# Patient Record
Sex: Male | Born: 1965 | Race: White | Hispanic: No | Marital: Married
Health system: Midwestern US, Academic
[De-identification: ages and names within clinical notes are randomized; demographics above are authoritative.]

---

## 2017-05-23 ENCOUNTER — Encounter: Admit: 2017-05-23 | Discharge: 2017-05-23

## 2017-05-23 DIAGNOSIS — E559 Vitamin D deficiency, unspecified: ICD-10-CM

## 2017-05-23 DIAGNOSIS — E039 Hypothyroidism, unspecified: ICD-10-CM

## 2017-05-23 DIAGNOSIS — E119 Type 2 diabetes mellitus without complications: ICD-10-CM

## 2017-05-23 DIAGNOSIS — E7800 Pure hypercholesterolemia, unspecified: Principal | ICD-10-CM

## 2017-12-29 IMAGING — US DUPRENAL
1 series · 13 of 25 positions shown · non-contrast
Comparison: none

[Series 1: us duplex renal artery · 13 of 66 slices shown]
[im 1/66]
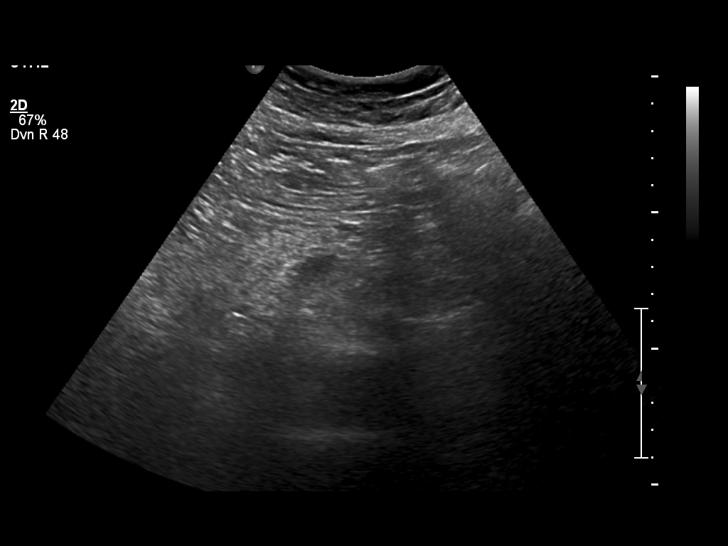
[im 6/66]
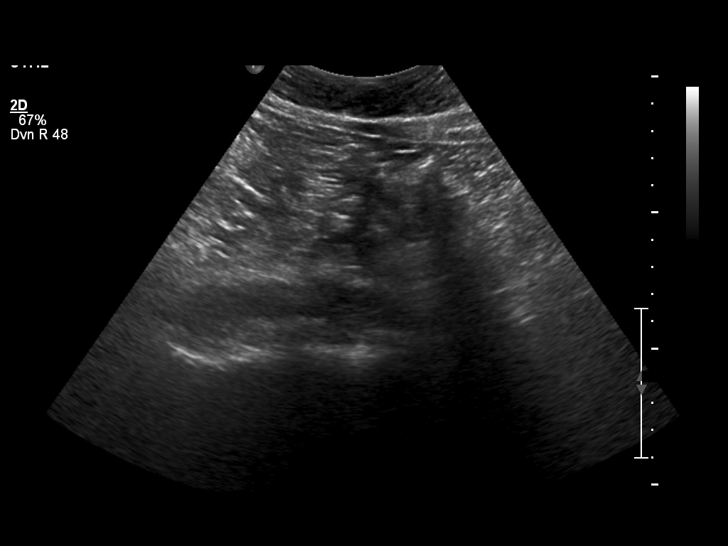
[im 11/66]
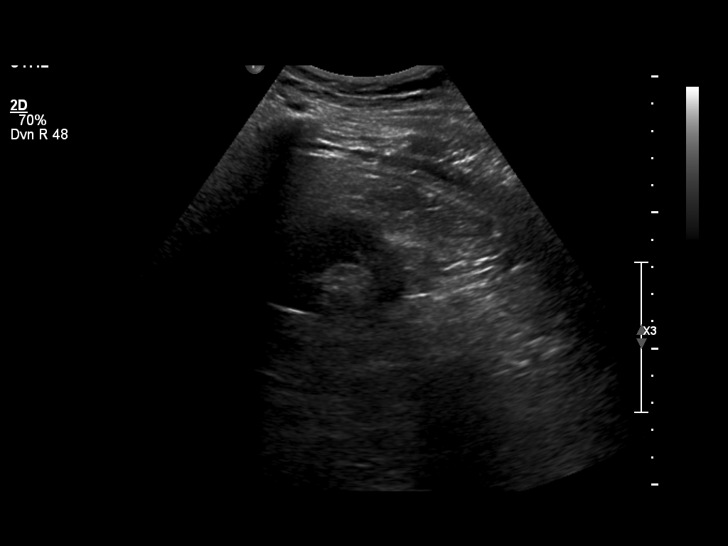
[im 17/66]
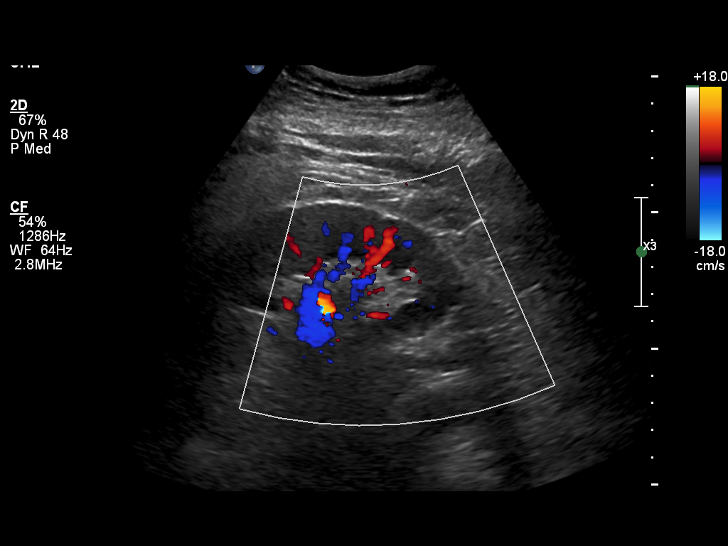
[im 22/66]
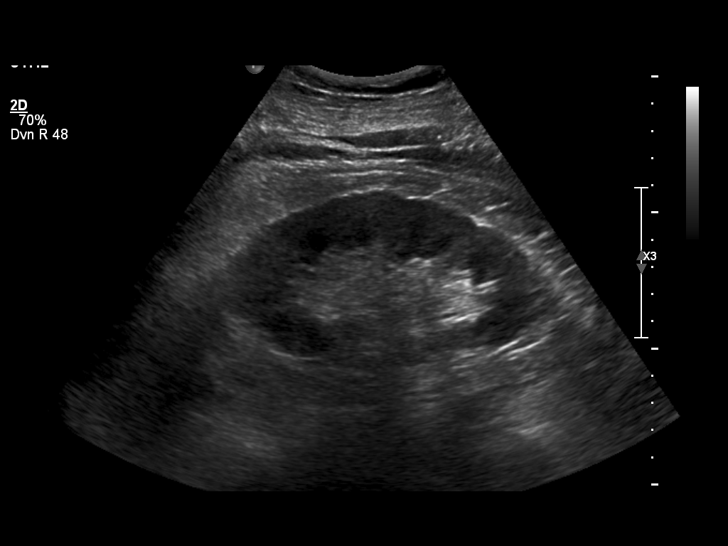
[im 28/66]
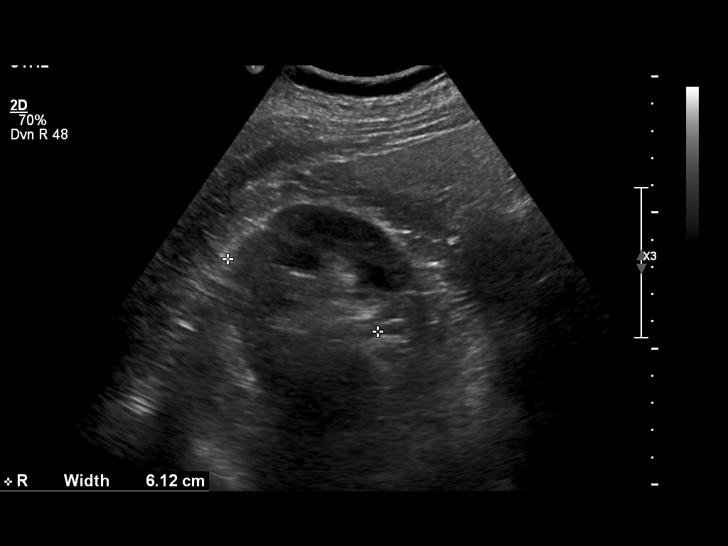
[im 33/66]
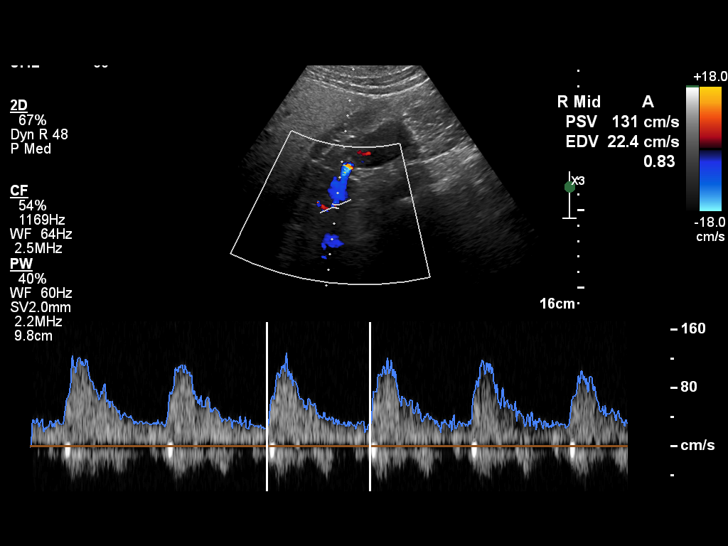
[im 38/66]
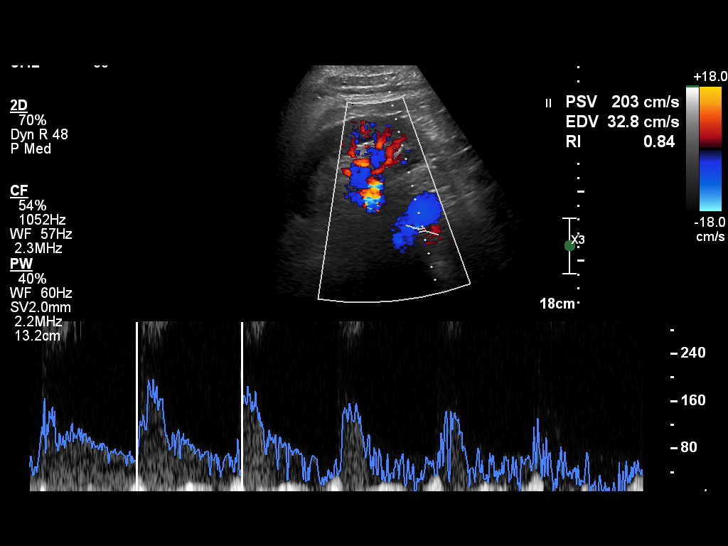
[im 44/66]
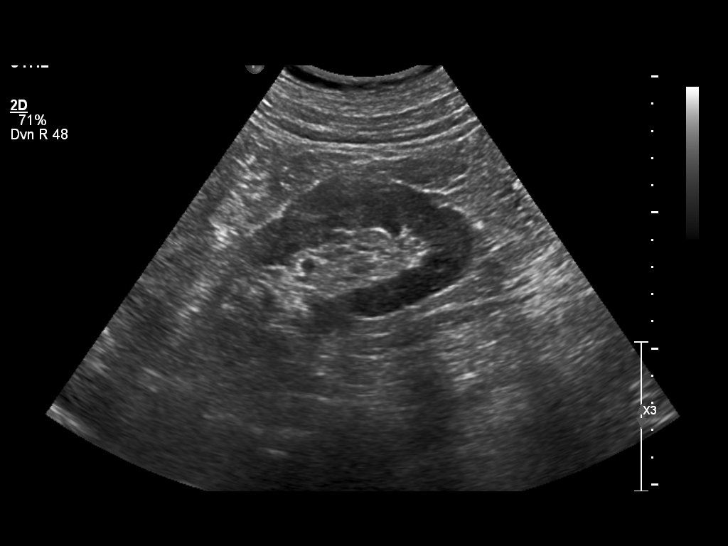
[im 49/66]
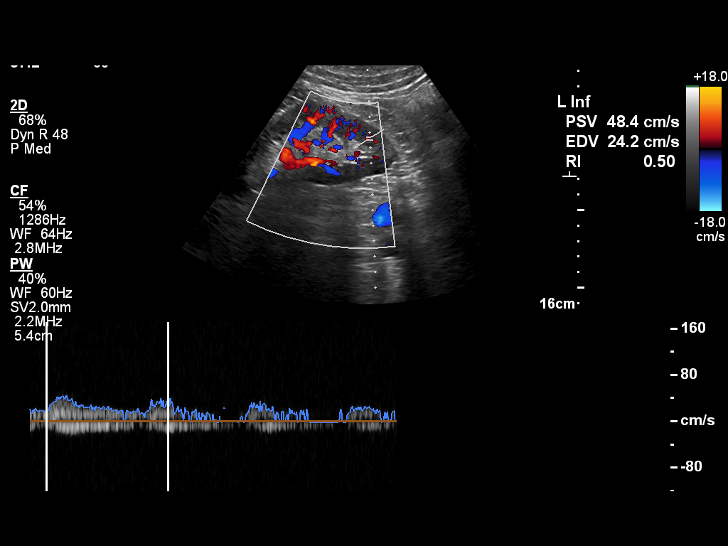
[im 55/66]
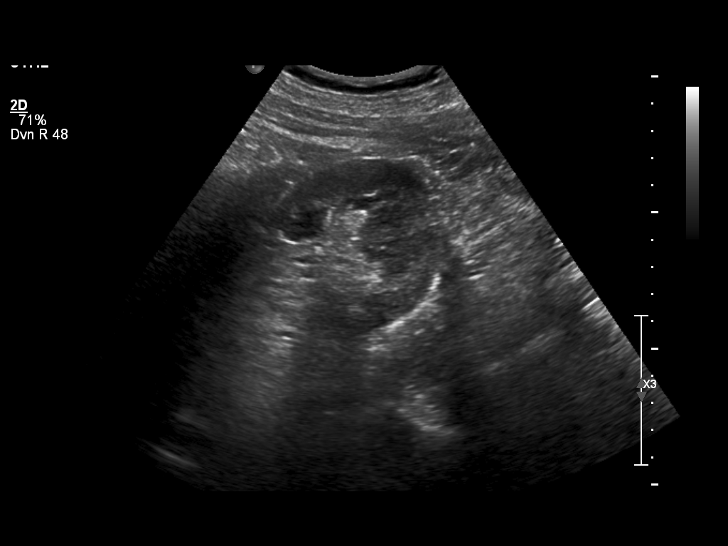
[im 60/66]
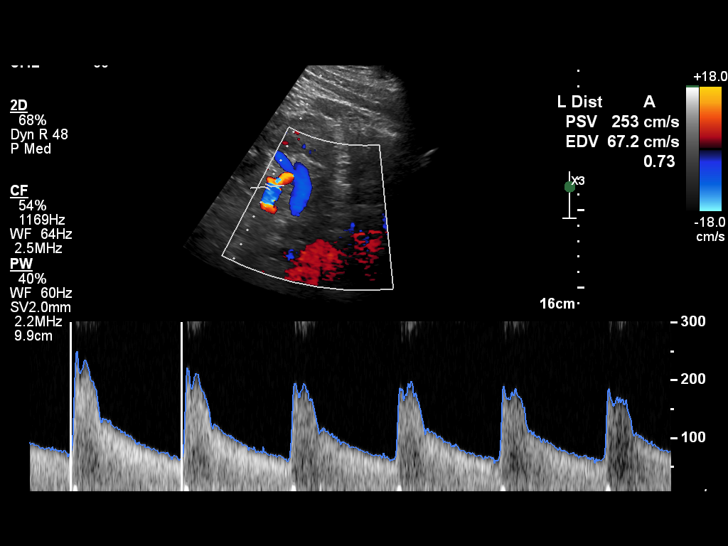
[im 66/66]
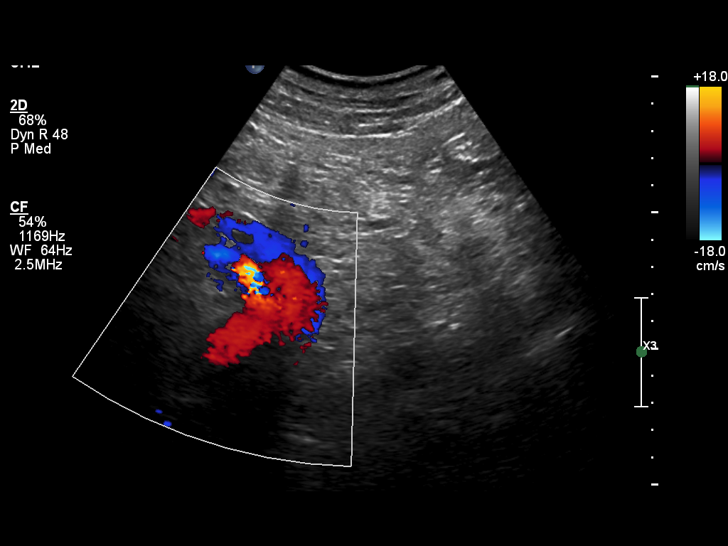

[13 of 25 positions shown; findings below may reference images not displayed]

EXAM
Renal duplex Doppler

INDICATION
New change (incr. Cr), decr eGFR
jl

TECHNIQUE
Multiple grayscale and duplex Doppler sonographic images of the bilateral kidneys were obtained.

COMPARISONS
None available

FINDINGS
The bilateral kidneys are normal in size, contour and echogenicity without evidence of mass, cyst,
hydronephrosis or shadowing calculus. The visualized bladder is unremarkable.
The right kidney demonstrates proximal flow velocity of 208 centimeters/second at the origin within
renal aortic ratio of 1.3. The mid artery demonstrates a flow velocity of 131 centimeters/second
with a renal aortic ratio 0.84. The distal arterial velocity is 163 with a renal aortic ratio 1.04.
The left kidney renal artery origin demonstrates a flow velocity of 180 centimeters/second with a
renal aortic ratio 0.15. The mid artery demonstrates a flow velocity of 109 centimeters/second with
a renal aortic ratio 0.69. The distal artery demonstrates an elevated flow velocity of 253
centimeters/second with an renal aortic ratio 162.

IMPRESSION
Somewhat elevated flow velocities within the bilateral bilateral kidneys on the right side within
the proximal artery and on the left side in the distal artery without significantly elevated renal
aortic ratio to suggest critical stenosis.
Otherwise unremarkable kidneys.

Tech Notes:

jl

## 2018-08-07 IMAGING — CR CHEST
2 series · 2 of 2 positions shown · non-contrast
Comparison: none

[chest pa x-wise]
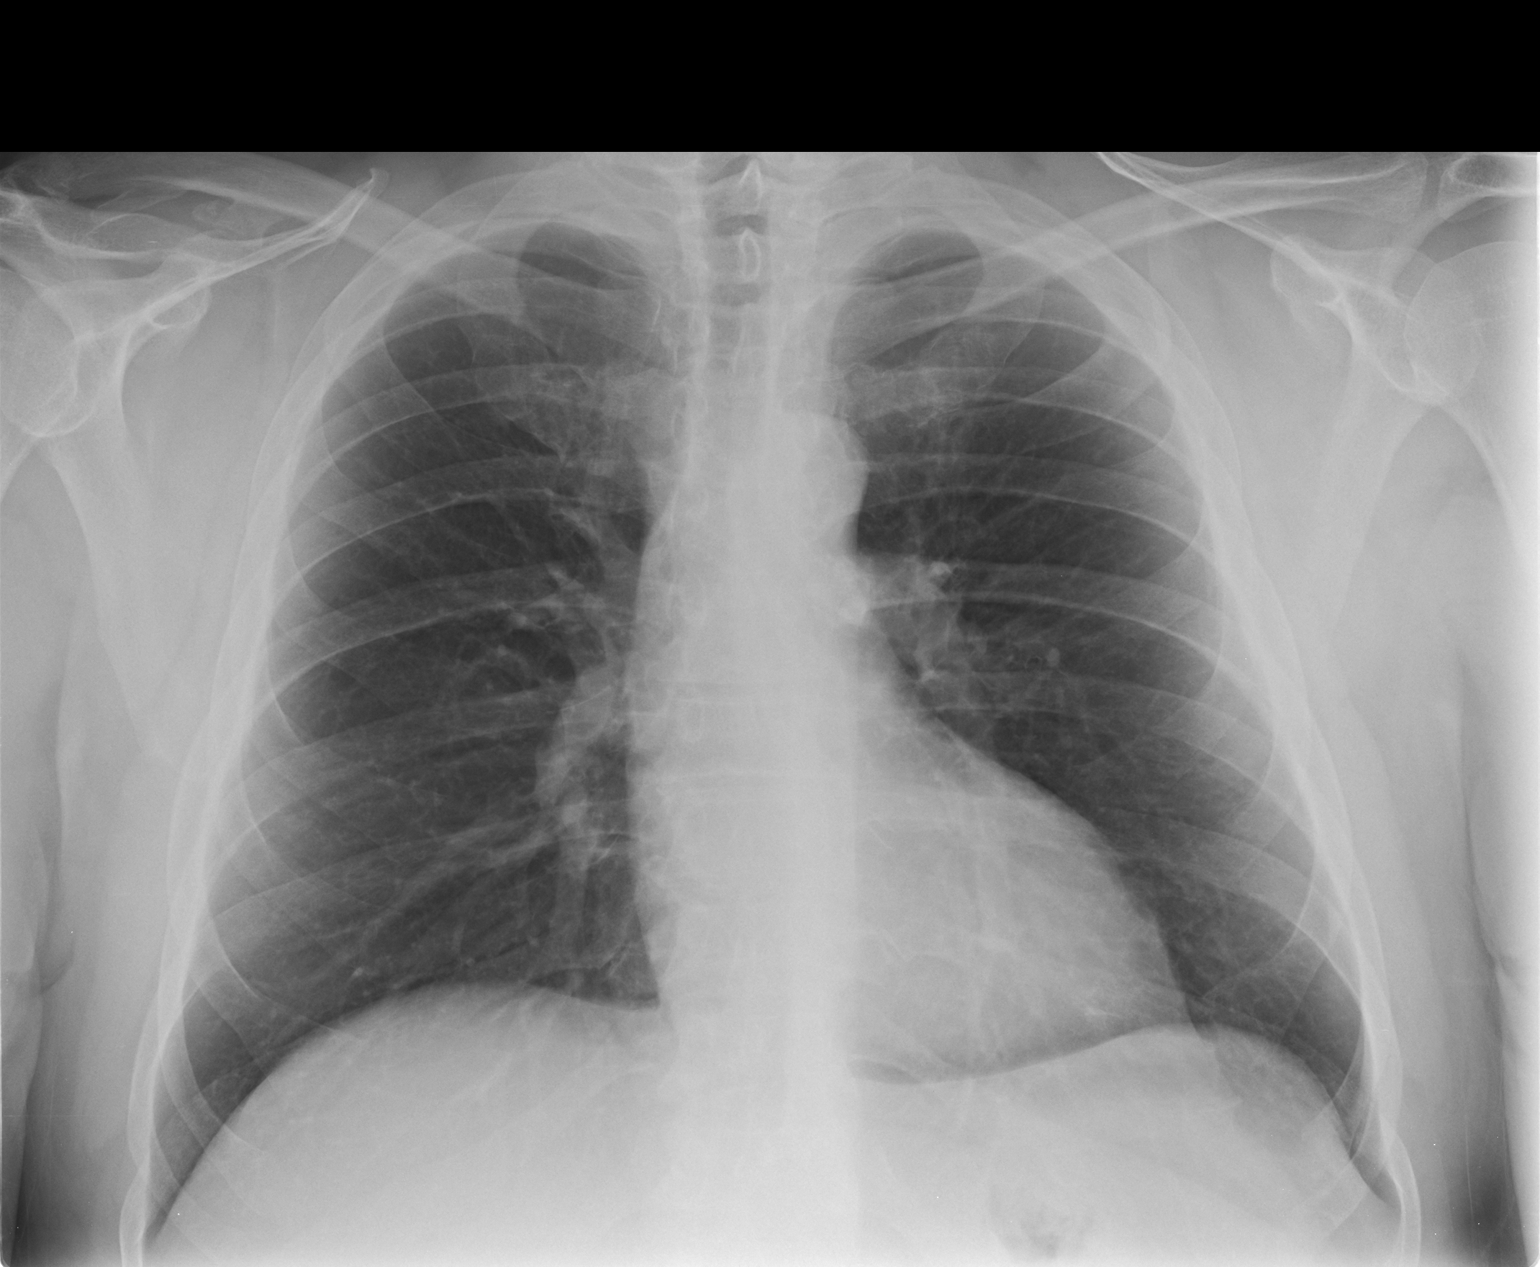

[chest lat]
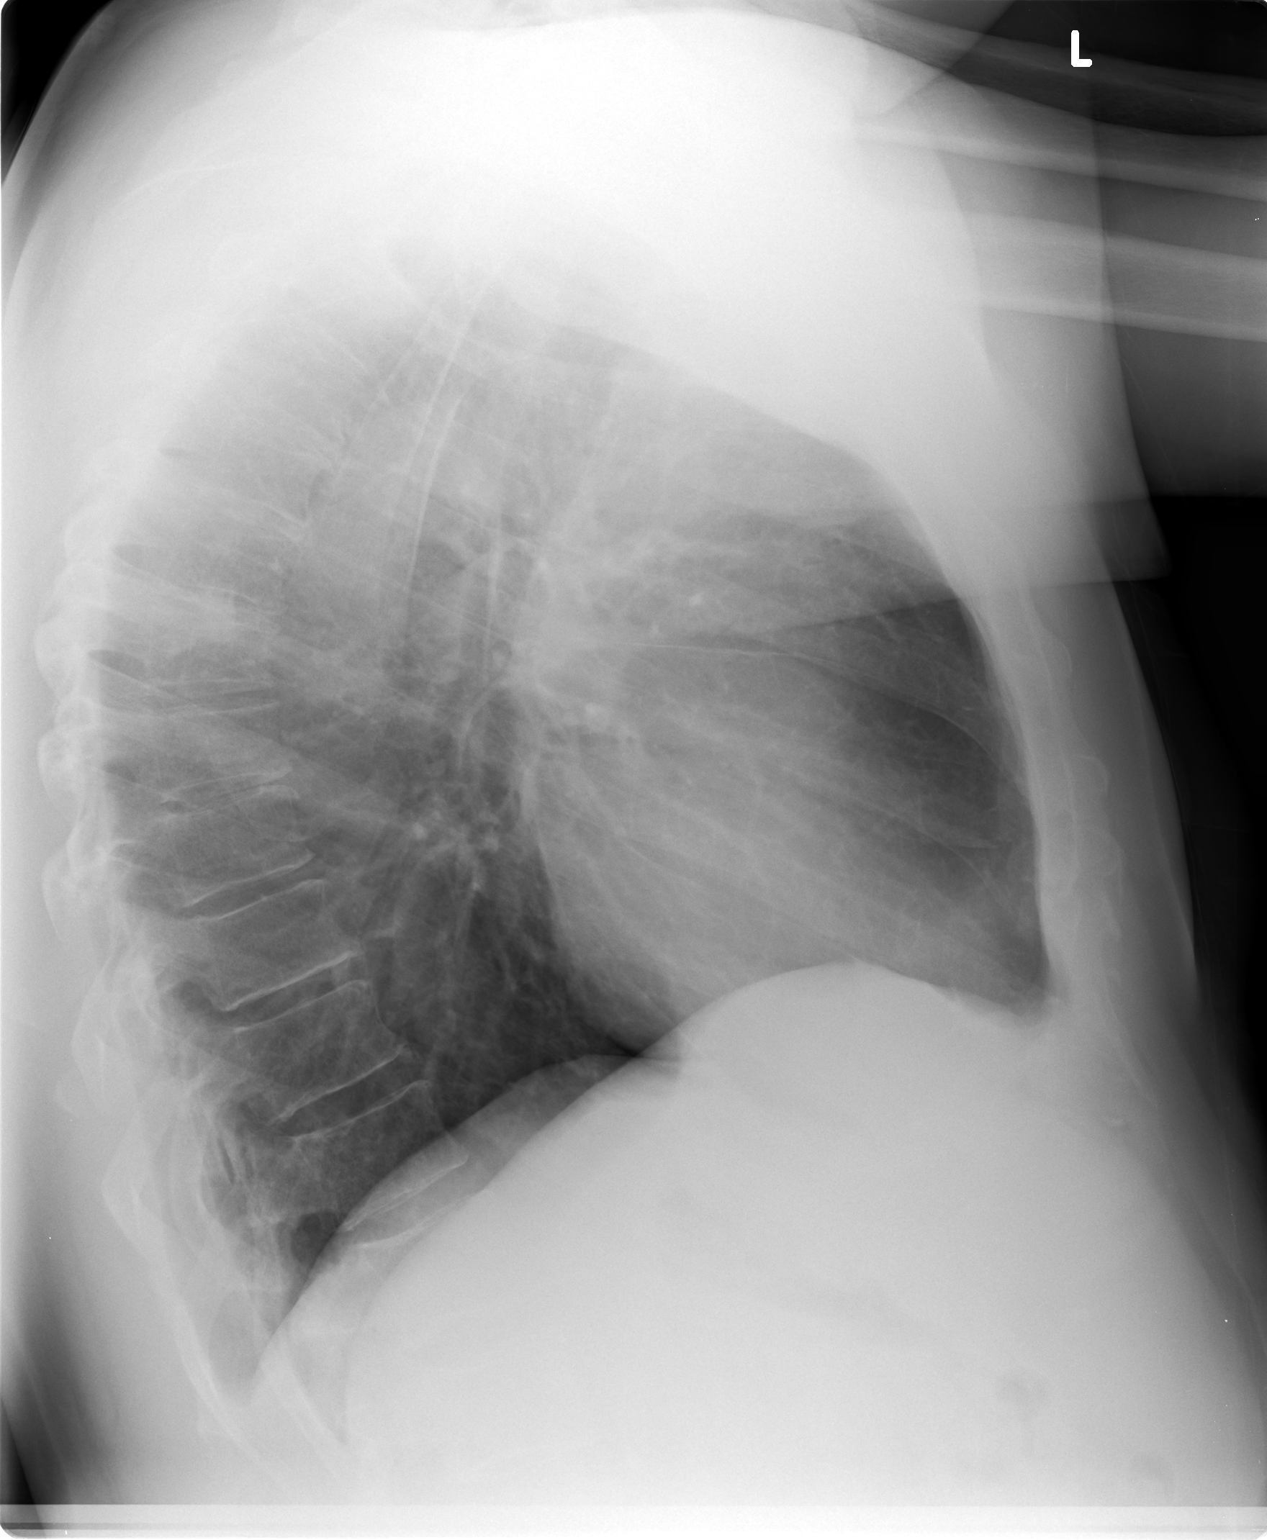

[2 of 2 positions shown; findings below may reference images not displayed]

DIAGNOSTIC STUDIES

EXAM

RADIOLOGICAL EXAMINATION, CHEST; 2 VIEWS FRONTAL AND LATERAL CPT 32141

INDICATION

fever, right rib pain
RT SIDED RIB PAIN WITH FEVER, NEW ONSET.

TECHNIQUE

2 views of the chest were acquired.

COMPARISONS

No prior studies are available for comparison.

FINDINGS

The cardiac silhouette is within normal limits for size. The mediastinum is not widened or
deviated. The lungs are clear and the costophrenic sulci are sharp. Pulmonary vasculature is normal
caliber. No pneumothorax is identified.

IMPRESSION

No acute cardiopulmonary process.

Tech Notes:

RT SIDED RIB PAIN WITH FEVER, NEW ONSET.

## 2021-05-18 IMAGING — MR L-spine^LUMBAR BLOCK
7 series · 41 of 48 positions shown · non-contrast
Comparison: none

[Series 3: T2 · sagittal · 4.0mm · 0.62mm/px · 5 of 13 slices shown (1 of 2)]
[im 1/13]
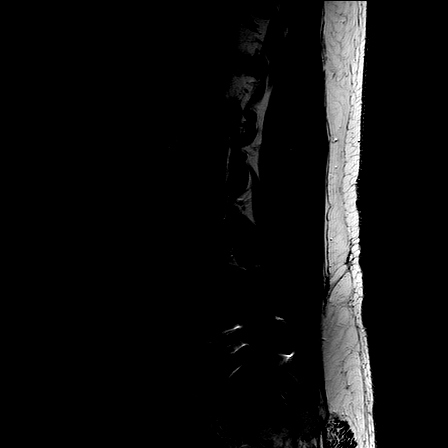
[im 4/13]
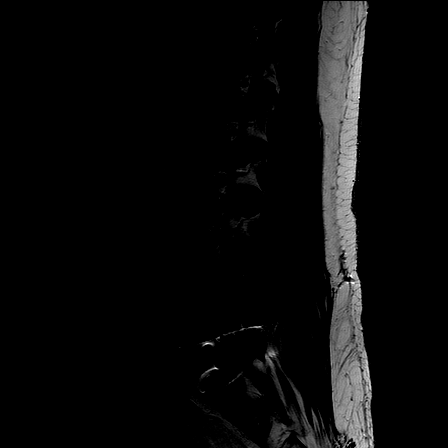
[im 7/13]
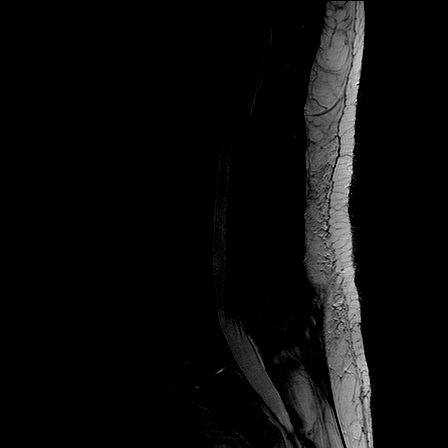
[im 10/13]
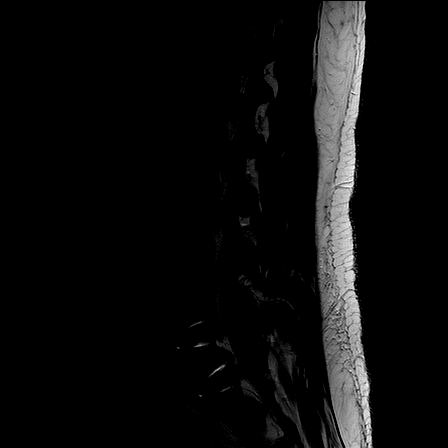
[im 13/13]
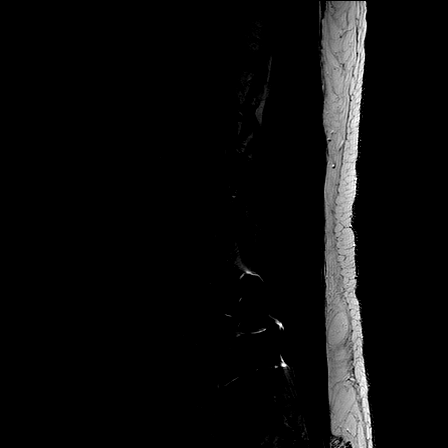

[Series 4: T1 · sagittal · 4.0mm · 0.73mm/px · 5 of 13 slices shown (1 of 2)]
[im 1/13]
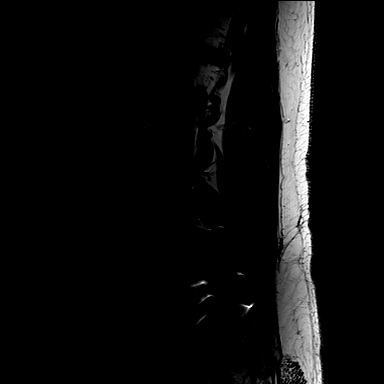
[im 4/13]
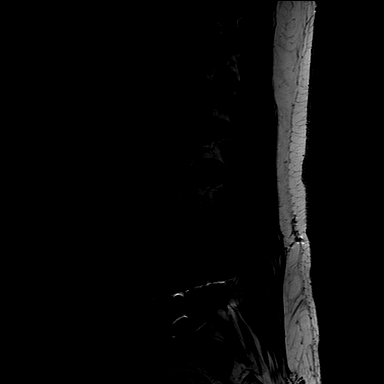
[im 7/13]
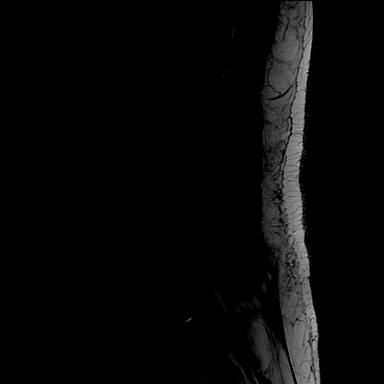
[im 10/13]
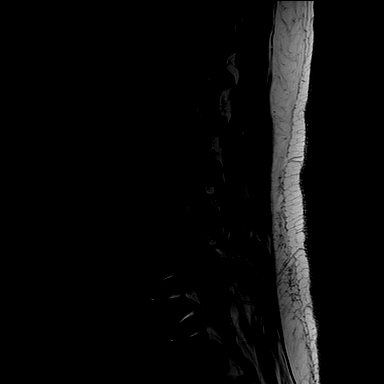
[im 13/13]
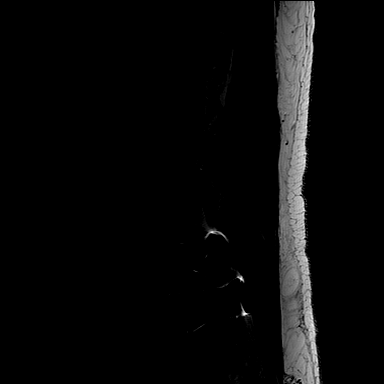

[Series 5: STIR · sagittal · 4.0mm · 0.55mm/px · 3 of 13 slices shown]
[im 1/13]
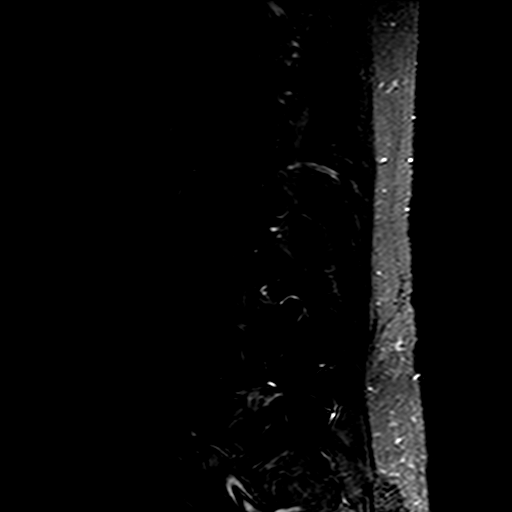
[im 5/13]
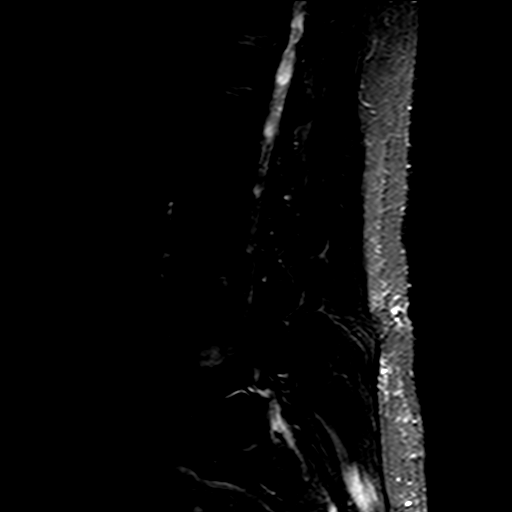
[im 9/13]
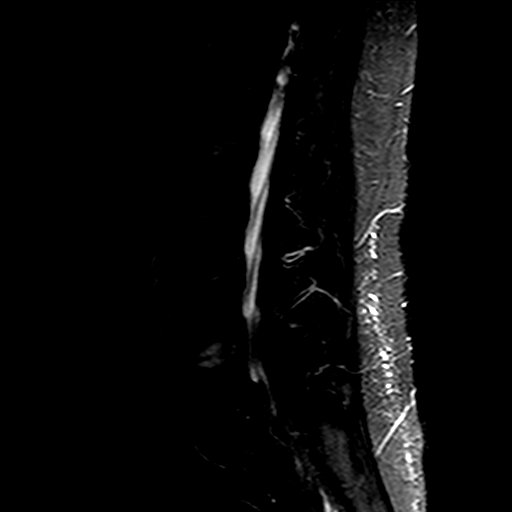

[Series 6: T2 · axial · 4.5mm · 0.49mm/px · z∈[-113,+79]mm · 8 of 29 slices shown (2 of 2)]
[im 1/29]
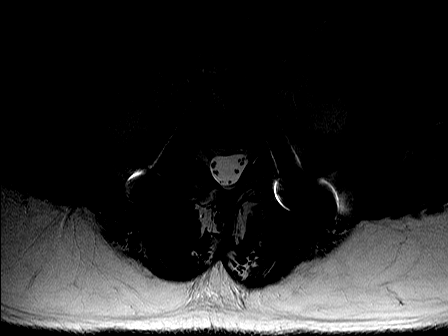
[im 4/29]
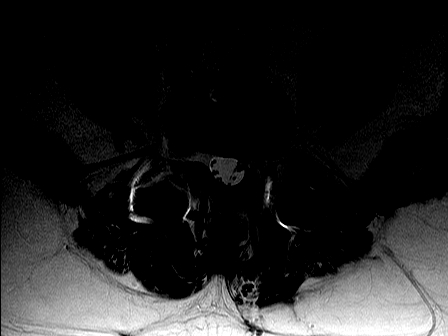
[im 10/29]
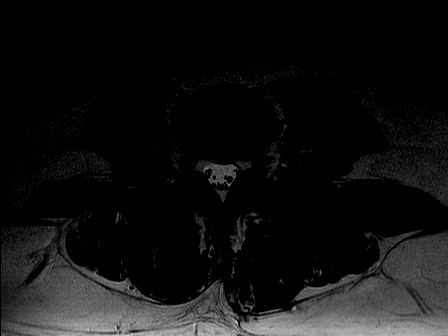
[im 13/29]
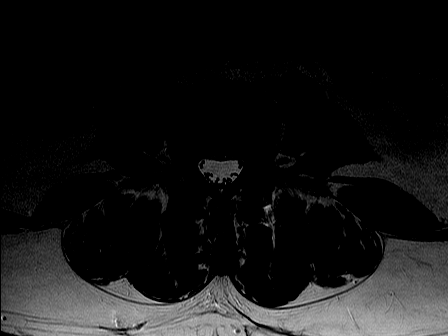
[im 16/29]
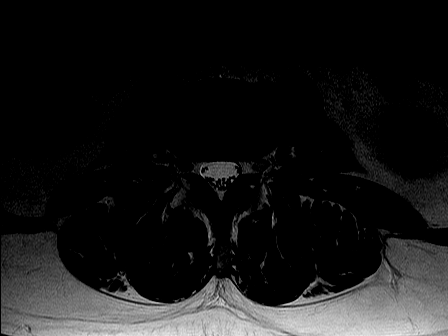
[im 19/29]
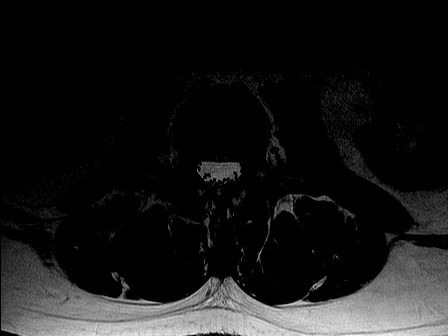
[im 25/29]
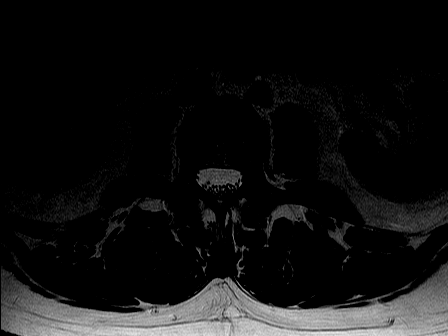
[im 29/29]
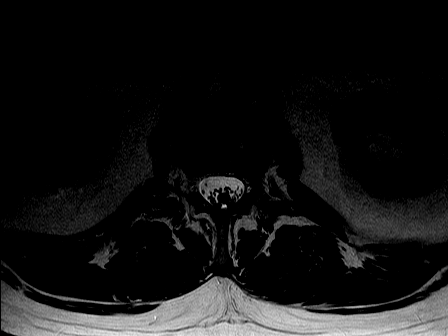

[Series 7: T1 · axial · 4.5mm · 0.86mm/px · z∈[-113,+79]mm · 8 of 29 slices shown (2 of 2)]
[im 1/29]
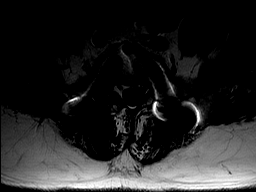
[im 4/29]
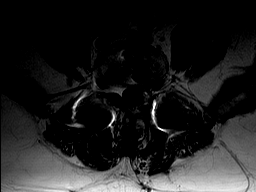
[im 10/29]
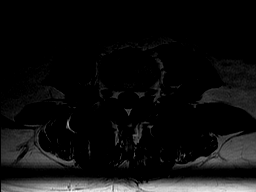
[im 13/29]
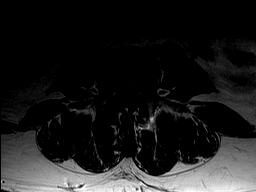
[im 16/29]
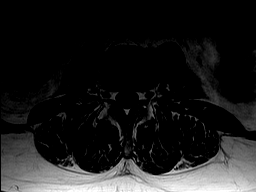
[im 19/29]
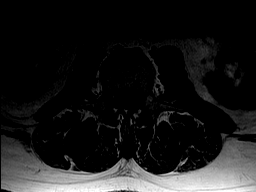
[im 25/29]
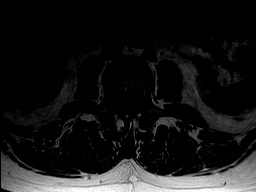
[im 29/29]
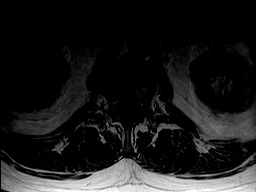

[Series 8: T1 fat-sat post-contrast · sagittal · 4.0mm · 0.88mm/px · 4 of 13 slices shown (1 of 2)]
[im 1/13]
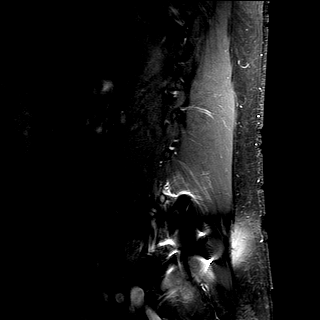
[im 5/13]
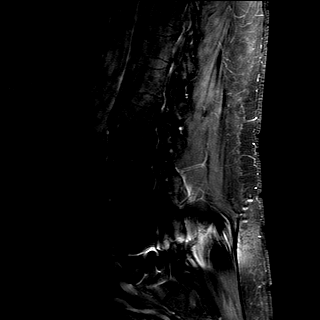
[im 9/13]
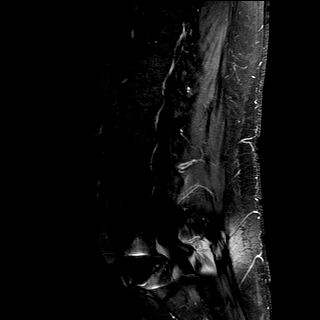
[im 13/13]
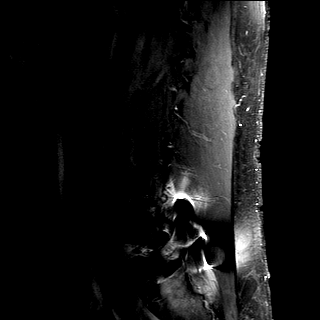

[Series 9: T1 fat-sat post-contrast · axial · 4.5mm · 0.86mm/px · z∈[-110,+81]mm · 8 of 29 slices shown (2 of 2)]
[im 1/29]
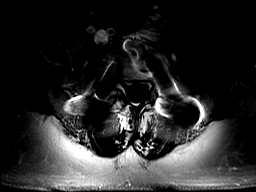
[im 4/29]
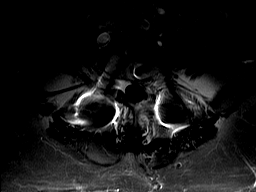
[im 10/29]
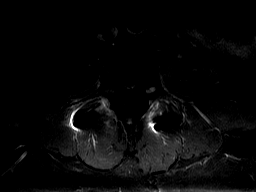
[im 13/29]
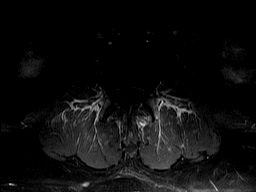
[im 16/29]
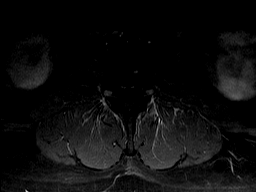
[im 19/29]
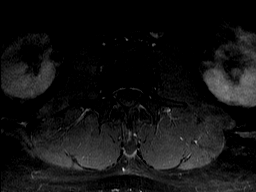
[im 25/29]
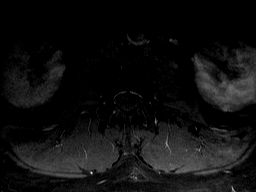
[im 29/29]
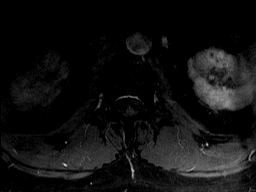

[41 of 48 positions shown; findings below may reference images not displayed]

Ordering:    TIGER, MACHO

DIAGNOSTIC STUDIES

EXAM

MRI lumbar spine with without contrast.

INDICATION

Lumbar radiculopathy
CHRONIC LOW BACK PAIN, RADIATION DOWN LEGS WITH NUMBNESS TO TOES, WITH FAILED INJECTION 3 WEEKS
AGO, HX LAMINECTOMY WITH FUSION [DATE] ML GADAVIST RG

TECHNIQUE

Sagittal and axial images were obtained with variable T1 and T2 weighting.

COMPARISONS

None available

FINDINGS

There are no plain films. Patient is assumed to have 5 lumbar type vertebral bodies.

Conus medullaris is normal in signal intensity and location. The intervertebral discs at T12-L1,
L1-2, and L2-3 are normal.

Minimal bulging is seen at L4-5 with mild facet hypertrophy resulting in mild neural foraminal
stenosis bilaterally.

L5-S1: Patient is status post bilateral transpedicular screw placement and posterior fixation at
this level. This results in significant susceptibility artifact. There is mild-to-moderate left
neural foraminal stenosis. Right neural foramina is well preserved. No central canal narrowing is
evident.

IMPRESSION

Postop changes at L5-S1 with mild-to-moderate left neural foraminal stenosis.

Minimal neural foraminal stenosis bilaterally at L4-5.

Tech Notes:

CHRONIC LOW BACK PAIN, RADIATION DOWN LEGS WITH NUMBNESS TO TOES, WITH FAILED INJECTION 3 WEEKS AGO,
HX LAMINECTOMY WITH FUSION [DATE] ML GADAVIST RG

## 2021-08-14 IMAGING — MR MR THORACIC SPINE^[PERSON_NAME] THORACIC
10 of 11 series · 42 of 48 positions shown · non-contrast
Comparison: none

[Series 2: cervical count · sagittal · 4.0mm · 0.98mm/px · 2 of 13 slices shown]
[im 1/13]
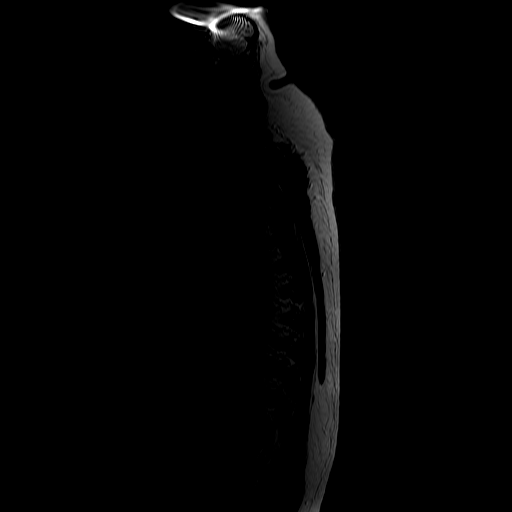
[im 13/13]
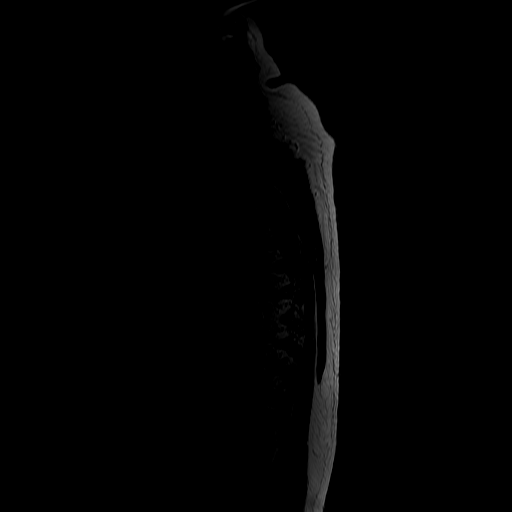

[Series 3: t2_(person_name)_(person_name)_sag · sagittal · 3.0mm · 1.25mm/px · 3 of 15 slices shown]
[im 1/15]
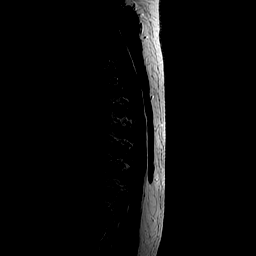
[im 8/15]
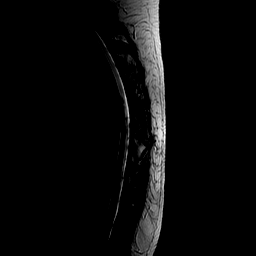
[im 15/15]
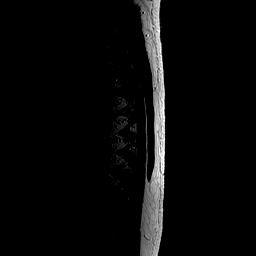

[Series 4: T1 · sagittal · 3.0mm · 1.25mm/px · 4 of 15 slices shown (1 of 2)]
[im 1/15]
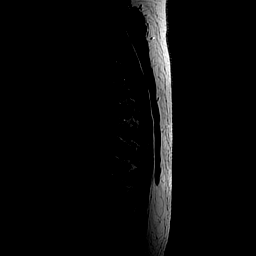
[im 5/15]
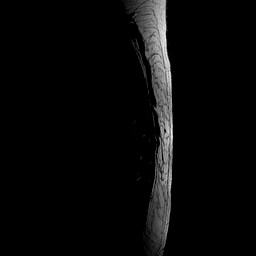
[im 10/15]
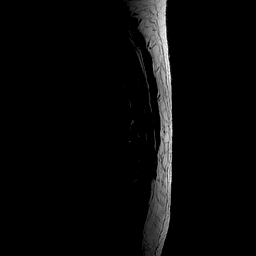
[im 15/15]
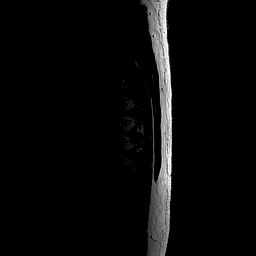

[Series 5: sag (id) · sagittal · 3.0mm · 1.25mm/px · 4 of 15 slices shown]
[im 1/15]
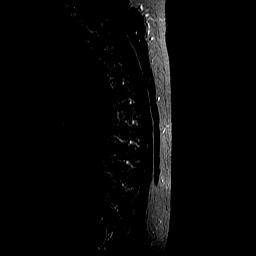
[im 5/15]
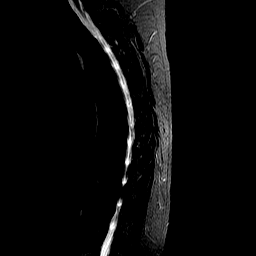
[im 10/15]
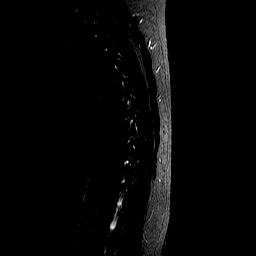
[im 15/15]
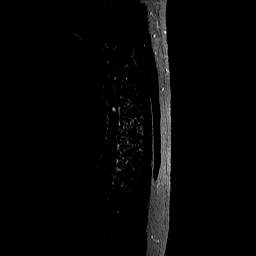

[Series 6: t2_(person_name)_(person_name)_(person_name) · axial · 5.0mm · 0.39mm/px · z∈[-66,+105]mm · 6 of 24 slices shown (1 of 2)]
[im 1/24]
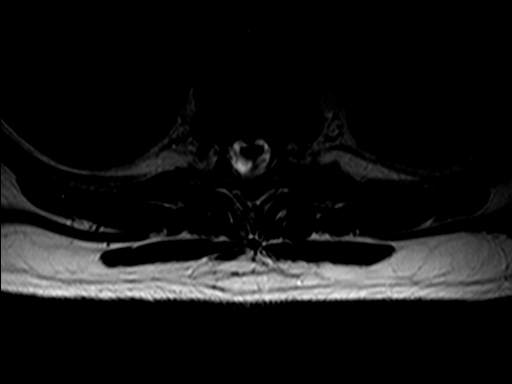
[im 5/24]
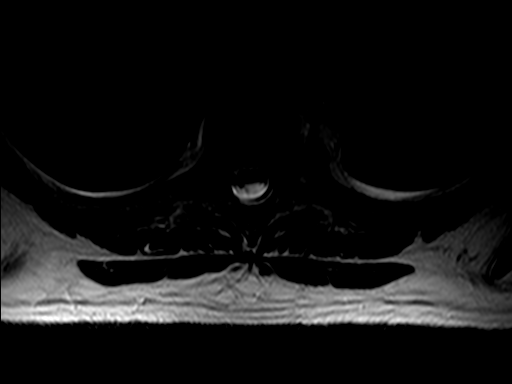
[im 10/24]
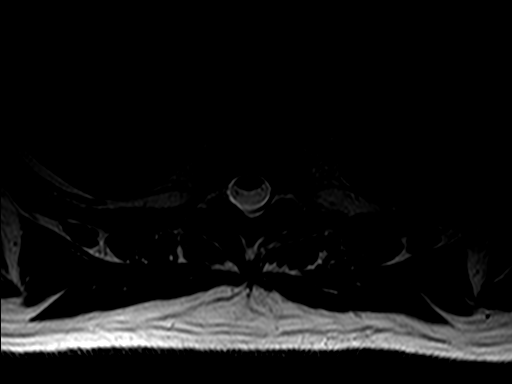
[im 14/24]
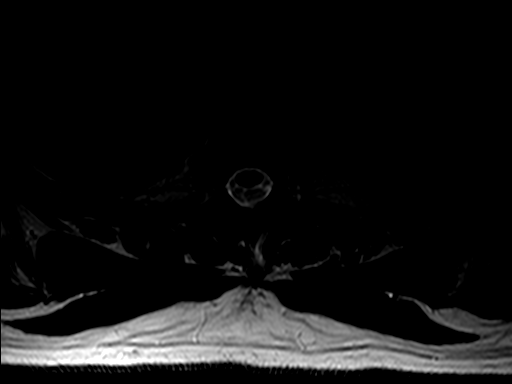
[im 19/24]
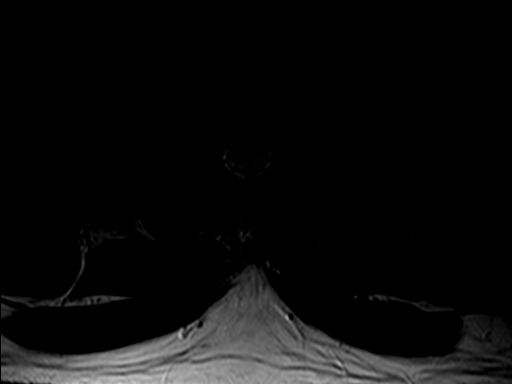
[im 24/24]
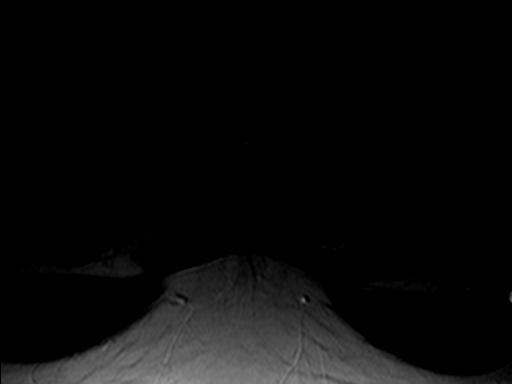

[Series 7: T2 · sagittal · 3.0mm · 1.25mm/px · 4 of 15 slices shown]
[im 1/15]
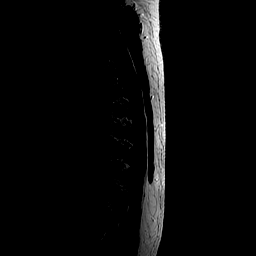
[im 5/15]
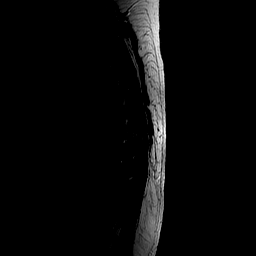
[im 10/15]
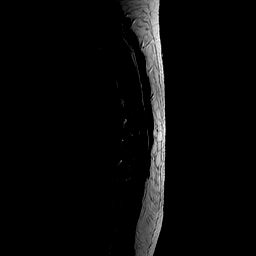
[im 15/15]
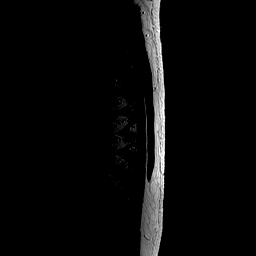

[Series 8: T1 · sagittal · 3.0mm · 1.25mm/px · 4 of 15 slices shown (2 of 2)]
[im 1/15]
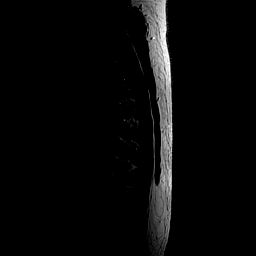
[im 5/15]
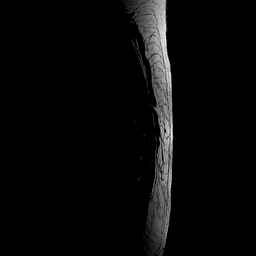
[im 10/15]
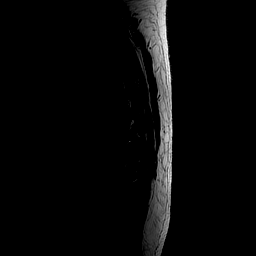
[im 15/15]
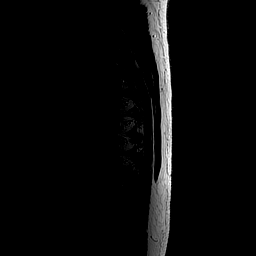

[Series 9: STIR · sagittal · 3.0mm · 1.25mm/px · 4 of 15 slices shown]
[im 1/15]
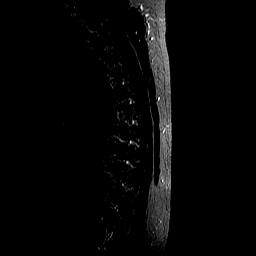
[im 5/15]
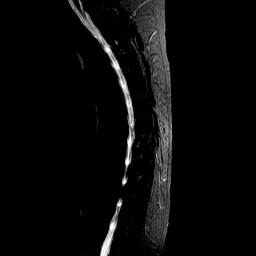
[im 10/15]
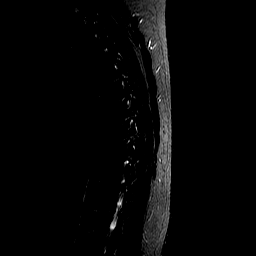
[im 15/15]
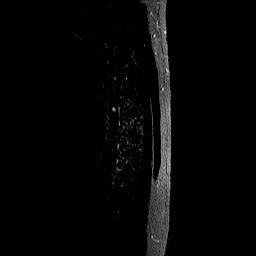

[Series 10: t2_(person_name)_(person_name)_(person_name) · axial · 5.0mm · 0.39mm/px · z∈[-176,+3]mm · 6 of 24 slices shown (2 of 2)]
[im 1/24]
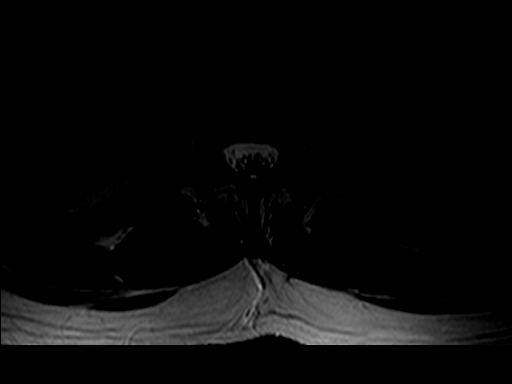
[im 5/24]
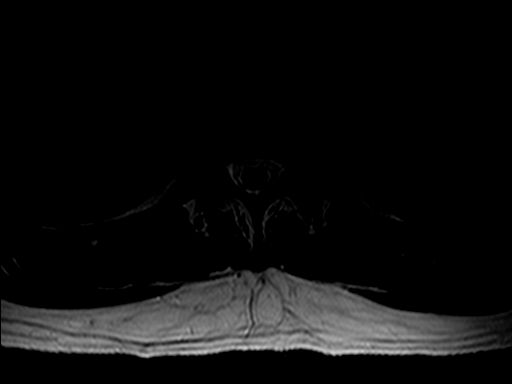
[im 10/24]
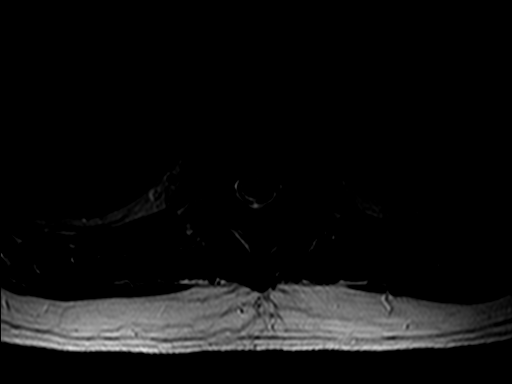
[im 14/24]
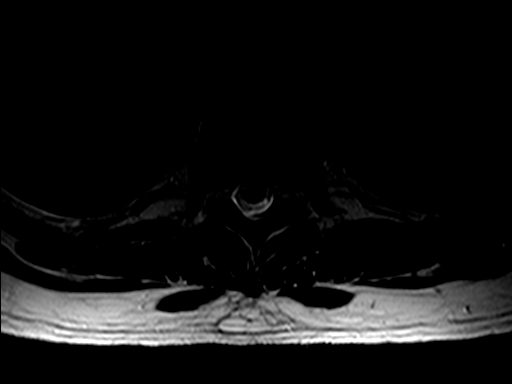
[im 19/24]
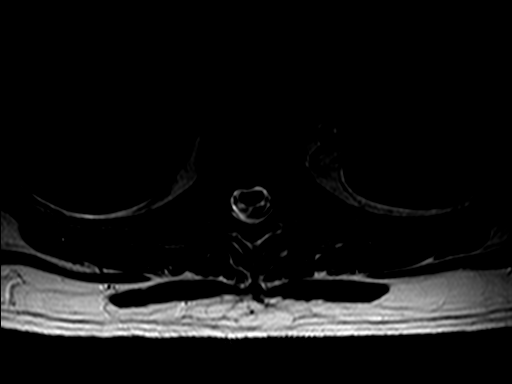
[im 24/24]
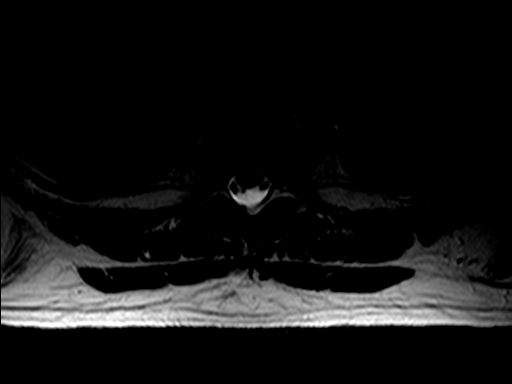

[Series 11: t1_(person_name)_(person_name) · axial · 5.0mm · 0.78mm/px · z∈[-66,+72]mm · 5 of 25 slices shown]
[im 1/25]
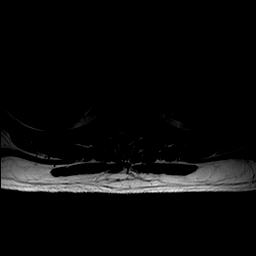
[im 5/25]
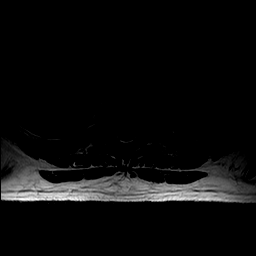
[im 10/25]
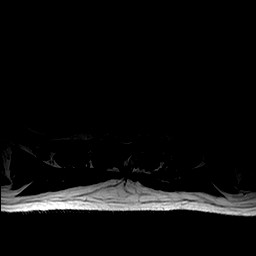
[im 15/25]
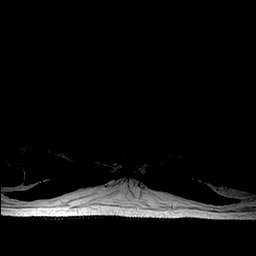
[im 20/25]
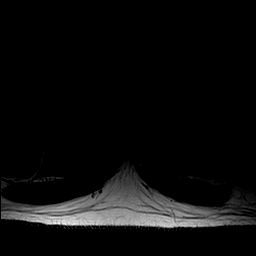

[42 of 48 positions shown; findings below may reference images not displayed]

DIAGNOSTIC STUDIES

EXAM

MAGNETIC RESONANCE IMAGING, SPINAL CANAL AND CONTENTS, THORACIC; WITHOUT CONTRAST MATERIAL CPT 67644

INDICATION

Back pain.

TECHNIQUE

Routine imaging sequences were obtained on a GE system.

COMPARISONS

Chest two view 08/07/2018

FINDINGS

There is chronic mild anterior wedging T7. The marrow signal is slightly heterogeneous. There are
several small hemangiomas. No significant marrow edema. No acute displaced fracture or dislocation.
The spinal cord is normal in signal and caliber. There is no definite cord compression. No
significant degenerative changes within the upper thoracic spine.

T7/8: Minimal diffuse disc bulge. No central canal or neural foramen stenosis.

T8/9: Minimal diffuse disc bulge. No central canal or neural foramen stenosis.

T9/10: No disc bulge.

T10/11: Mild diffuse disc bulge without central canal or neural foramen stenosis.

IMPRESSION

1. Mild degenerative changes within the lower thoracic spine.

Tech Notes:

MID BACK PAIN WITH RADIATION TO LEGS.  RG

## 2021-09-09 ENCOUNTER — Encounter: Admit: 2021-09-09 | Discharge: 2021-09-09

## 2021-09-09 ENCOUNTER — Ambulatory Visit: Admit: 2021-09-09 | Discharge: 2021-09-09

## 2021-09-09 DIAGNOSIS — I4510 Unspecified right bundle-branch block: Secondary | ICD-10-CM

## 2021-09-09 IMAGING — US ECHOCOMPL
1 series · 12 of 24 positions shown · non-contrast
Comparison: none

[Series 1: us echo 2d, complete · 116 acquisitions, 12 frames shown]
[im 6/116]
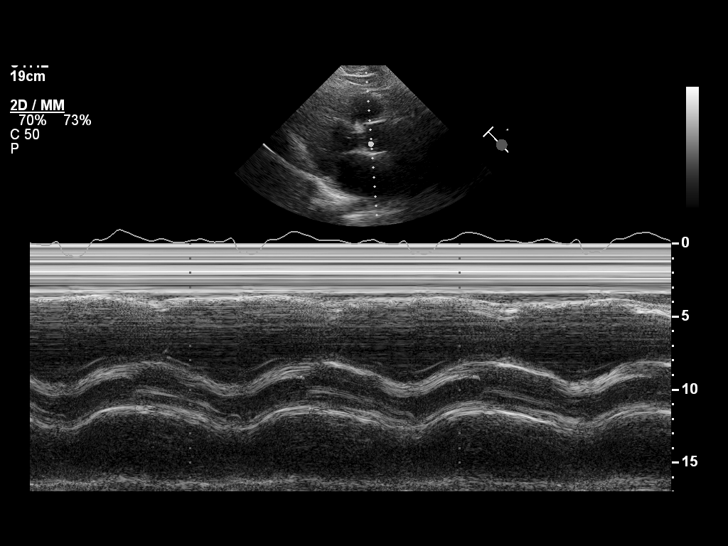
[im 16/116]
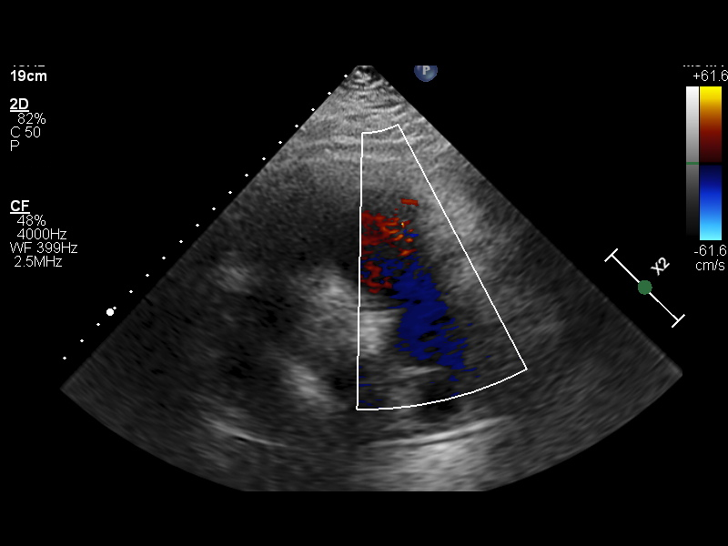
[im 26/116]
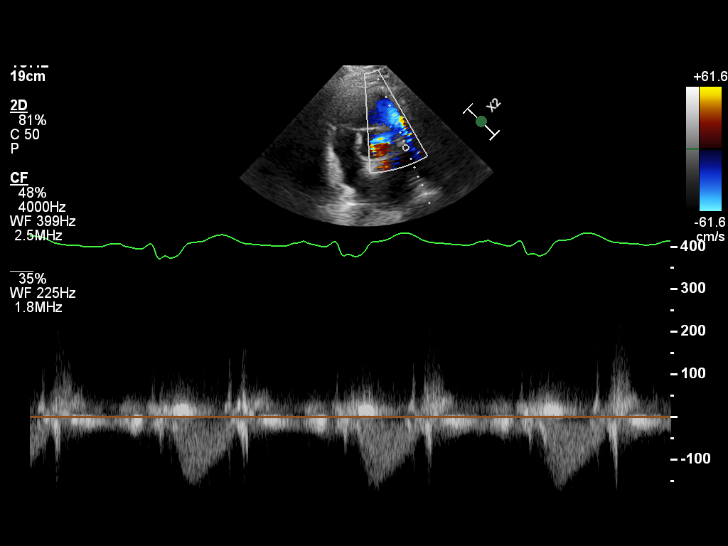
[im 36/116]
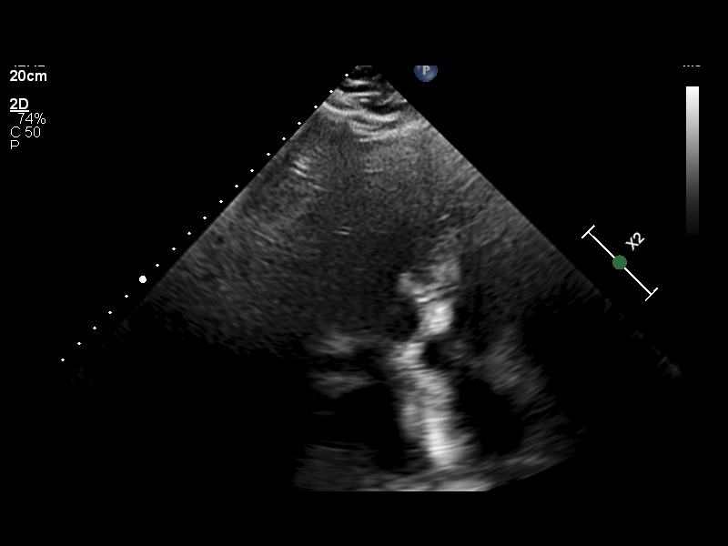
[im 46/116]
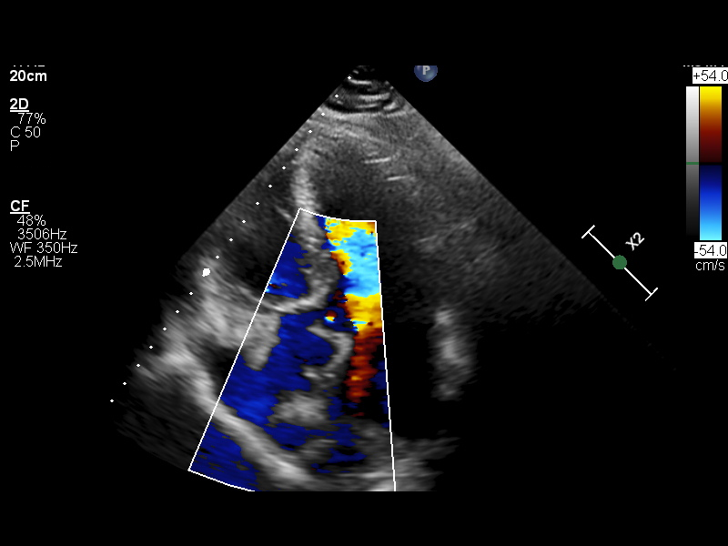
[im 61/116]
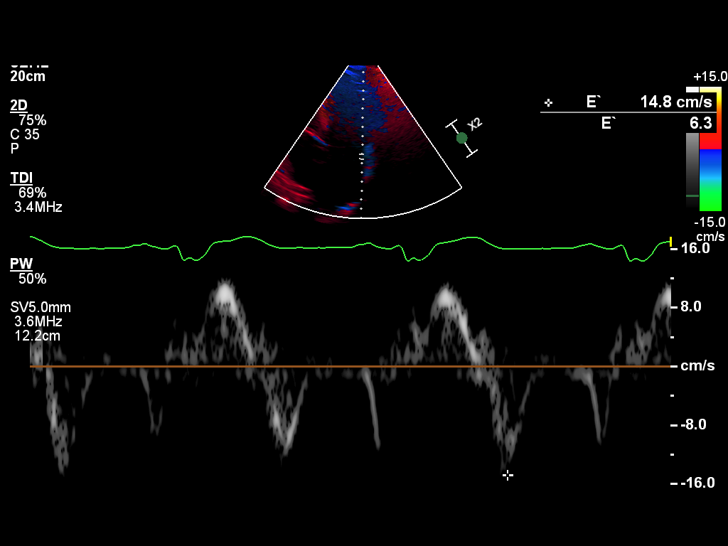
[im 71/116]
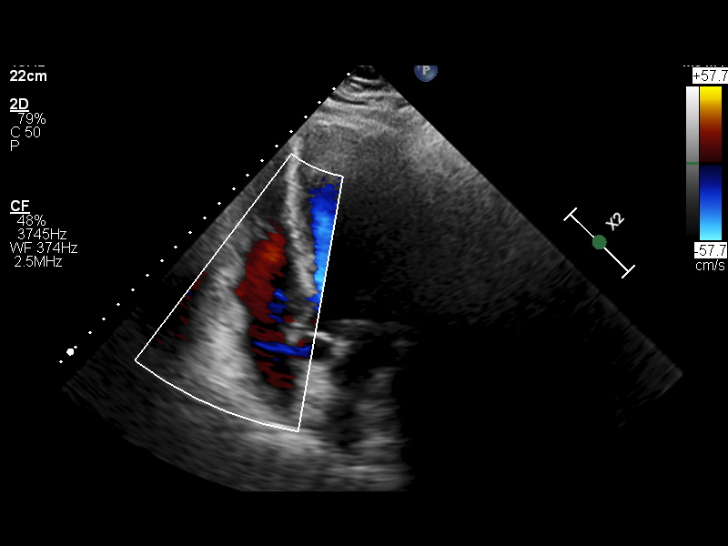
[im 76/116]
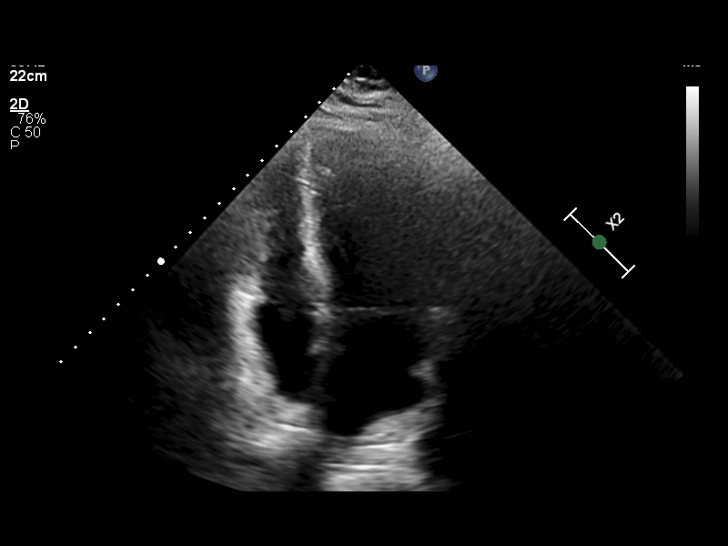
[im 91/116]
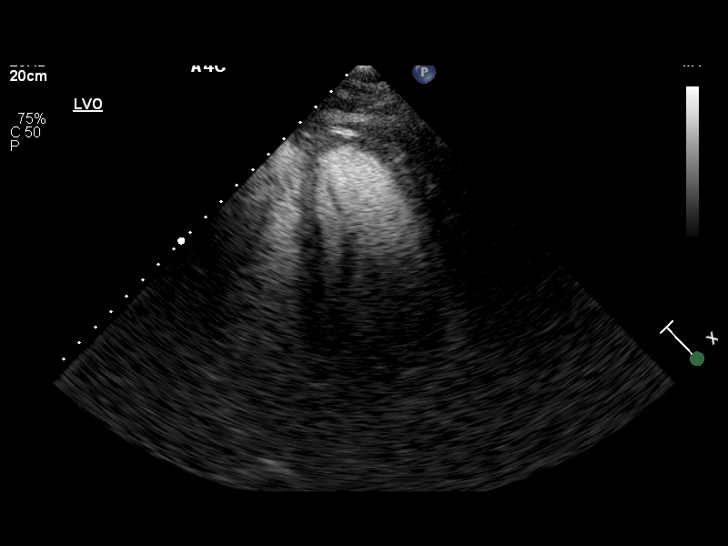
[im 96/116]
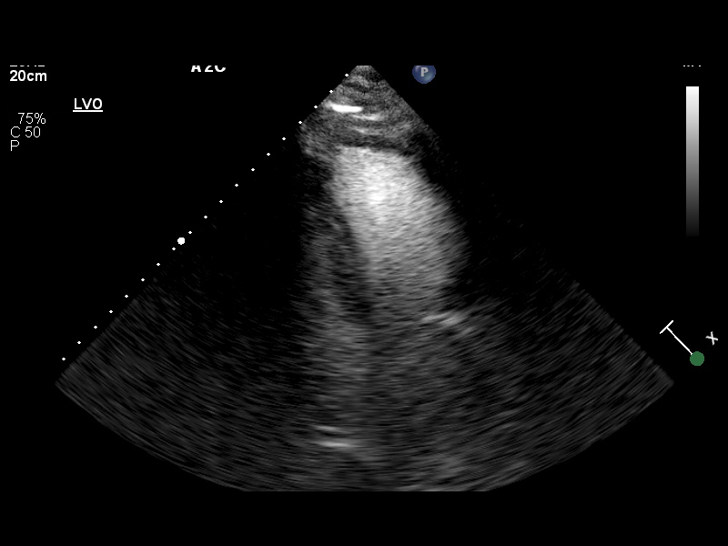
[im 106/116]
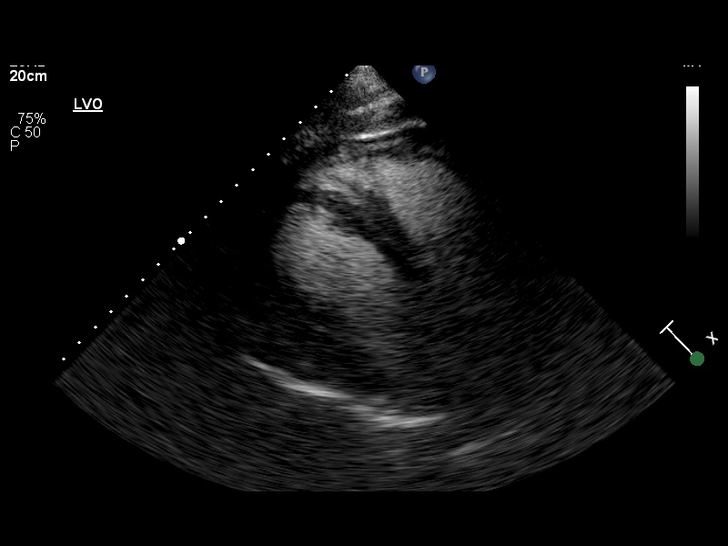
[im 116/116]
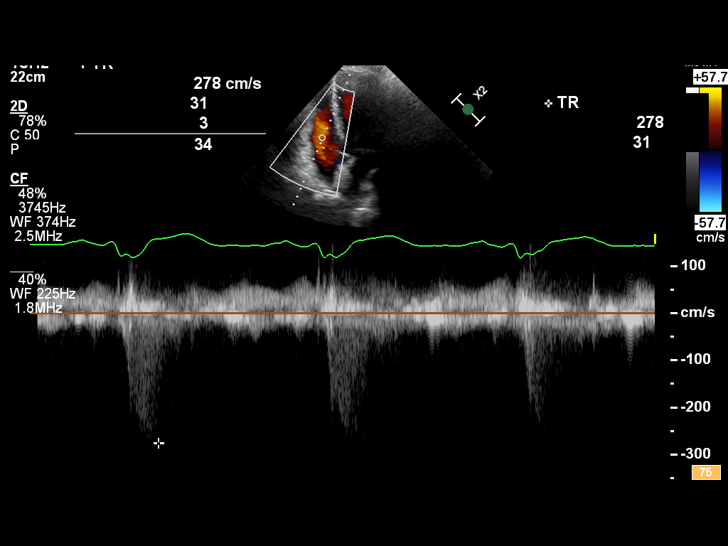

[12 of 24 positions shown; findings below may reference images not displayed]

09/09/21 -  2D + DOPPLER ECHO
Location Performed: [HOSPITAL]

Referring Provider:
Fellow:
Location of Interp: [facility]
Sonographer: External Staff
Machine:  [employer]

Indications:           Right bundle branch block

Definity contrast was given to enhance imaging.

Vitals
Height   Weight   BSA (Calculated)   BP   Comments
180.3 cm (5' 11")   100 kg (220 lb 7.4 oz)   2.24   140/88

Interpretation Summary
Technically difficult study.
LVEF=70%.
Normal LV size with hyperdynamic systolic function.
No regional wall abnormalities identified on the echo contrast images.
Right ventricular size and function are normal.
Moderate left atrial enlargement.
The aortic valve is trileaflet with nonspecific focal thickening without impairment in leaflet
mobility or reduction in excursion. Increased gradients and velocities are likely from hyperdynamic
LV function not aortic valve stenosis.
No pericardial effusion.
Estimated pulmonary artery systolic pressure is 39 mmHg.

There are no previous studies available for comparison.

Echocardiographic Findings
Left Ventricle   The left ventricular size is normal. Mild concentric hypertrophy. The left
ventricular systolic function is hyperdynamic. The visually estimated ejection fraction is 70%. The
ejection fraction by Simpson's biplane method is 76%. Cannot determine left ventricular diastolic
function. Normal left atrial pressure.
Right Ventricle   The right ventricular size is normal. The right ventricular systolic function is
normal.
Left Atrium   Moderately dilated.
Right Atrium   Normal size.
IVC/SVC   Elevated central venous pressure (5-10 mm Hg).
Mitral Valve   Normal valve structure. No stenosis. Trace regurgitation.
Tricuspid Valve   The tricuspid valve was not well seen. No stenosis. Trace regurgitation.
Aortic Valve   The aortic valve is trileaflet with nonspecific focal thickening without impairment
of leaflet mobility or reduction in excursion.  Increased gradients and velocities are likely from
hyperdynamic LV function. No regurgitation.
Pulmonary   The pulmonic valve was not seen well but no Doppler evidence of stenosis. No
regurgitation.
Aorta   The aortic root and ascending aorta are normal in size.
Pericardium   No pericardial effusion.

Left Ventricular Wall Scoring
Score Index: 1.000   Percent Normal: 0.0%

The left ventricular wall motion is globally hyperkinetic.

Left Heart 2D Measurements (Normal Ranges)
EF (Visual)
70 %
EF (Simpson's)
76 %
LVIDD
5.5 cm  (Range: 4.2 - 5.8)
LVIDS
4.3 cm  (Range: 2.5 - 4.0)
IVS
1.2 cm  (Range: 0.6 - 1.0)
LV PW
1.2 cm  (Range: 0.6 - 1.0)
LA Size
4.2 cm  (Range: 3.0 - 4.0)

Right Heart 2D   M-Mode Measurements (Normal Ranges) (Range)
RV Basal Dia
3.4 cm  (2.5 - 4.1)
RV Mid Dia
2.7 cm  (1.9 - 3.5)
IZAAG
14.5 cm2  (<18)
M-Mode TAPSE
1.7 cm  (>1.7)

Left Heart 2D Addnl Measurements (Normal Ranges)
LV Systolic Vol
44 mL  (Range: 21 - 61)
LV Systolic Vol Index
20 mL/m2  (Range: 11 - 31)
LV Diastolic Vol
142 mL  (Range: 62 - 150)
LV Diastolic Vol Index
63 mL/m2  (Range: 34 - 74)
LA Vol
102 mL  (Range: 18 - 58)
LA Vol Index
45.54 mL/m2  (Range: 16 - 34)
LV Mass
272 g  (Range: 88 - 224)
LV Mass Index
122 g/m2  (Range: 49 - 115)
RWT
0.44  (Range: <=0.42)

Aortic Root Measurements (Normal Ranges)
Sinus
3.2 cm  (Range: 2.8 - 4.0)
TIBURON MARR
3.2 cm

Doppler (Spectral and Color Flow)
Estimated Peak Systolic PA Pressure
Aortic valve area
3.51 cm2
Aortic valve mean gradient
Aortic valve peak gradient
Aortic valve peak velocity
2.6 m/s
Aortic valve velocity ratio

Tech Notes:

definity given lot #[id number]

## 2021-09-17 ENCOUNTER — Encounter: Admit: 2021-09-17 | Discharge: 2021-09-17 | Payer: BC Managed Care – HMO

## 2021-09-17 ENCOUNTER — Ambulatory Visit: Admit: 2021-09-17 | Discharge: 2021-09-17 | Payer: BC Managed Care – HMO

## 2021-09-17 DIAGNOSIS — I10 Essential (primary) hypertension: Secondary | ICD-10-CM

## 2021-09-17 DIAGNOSIS — I4510 Unspecified right bundle-branch block: Secondary | ICD-10-CM

## 2021-09-17 IMAGING — CR [ID]
2 series · 2 of 2 positions shown · non-contrast
Comparison: none

[chest pa]
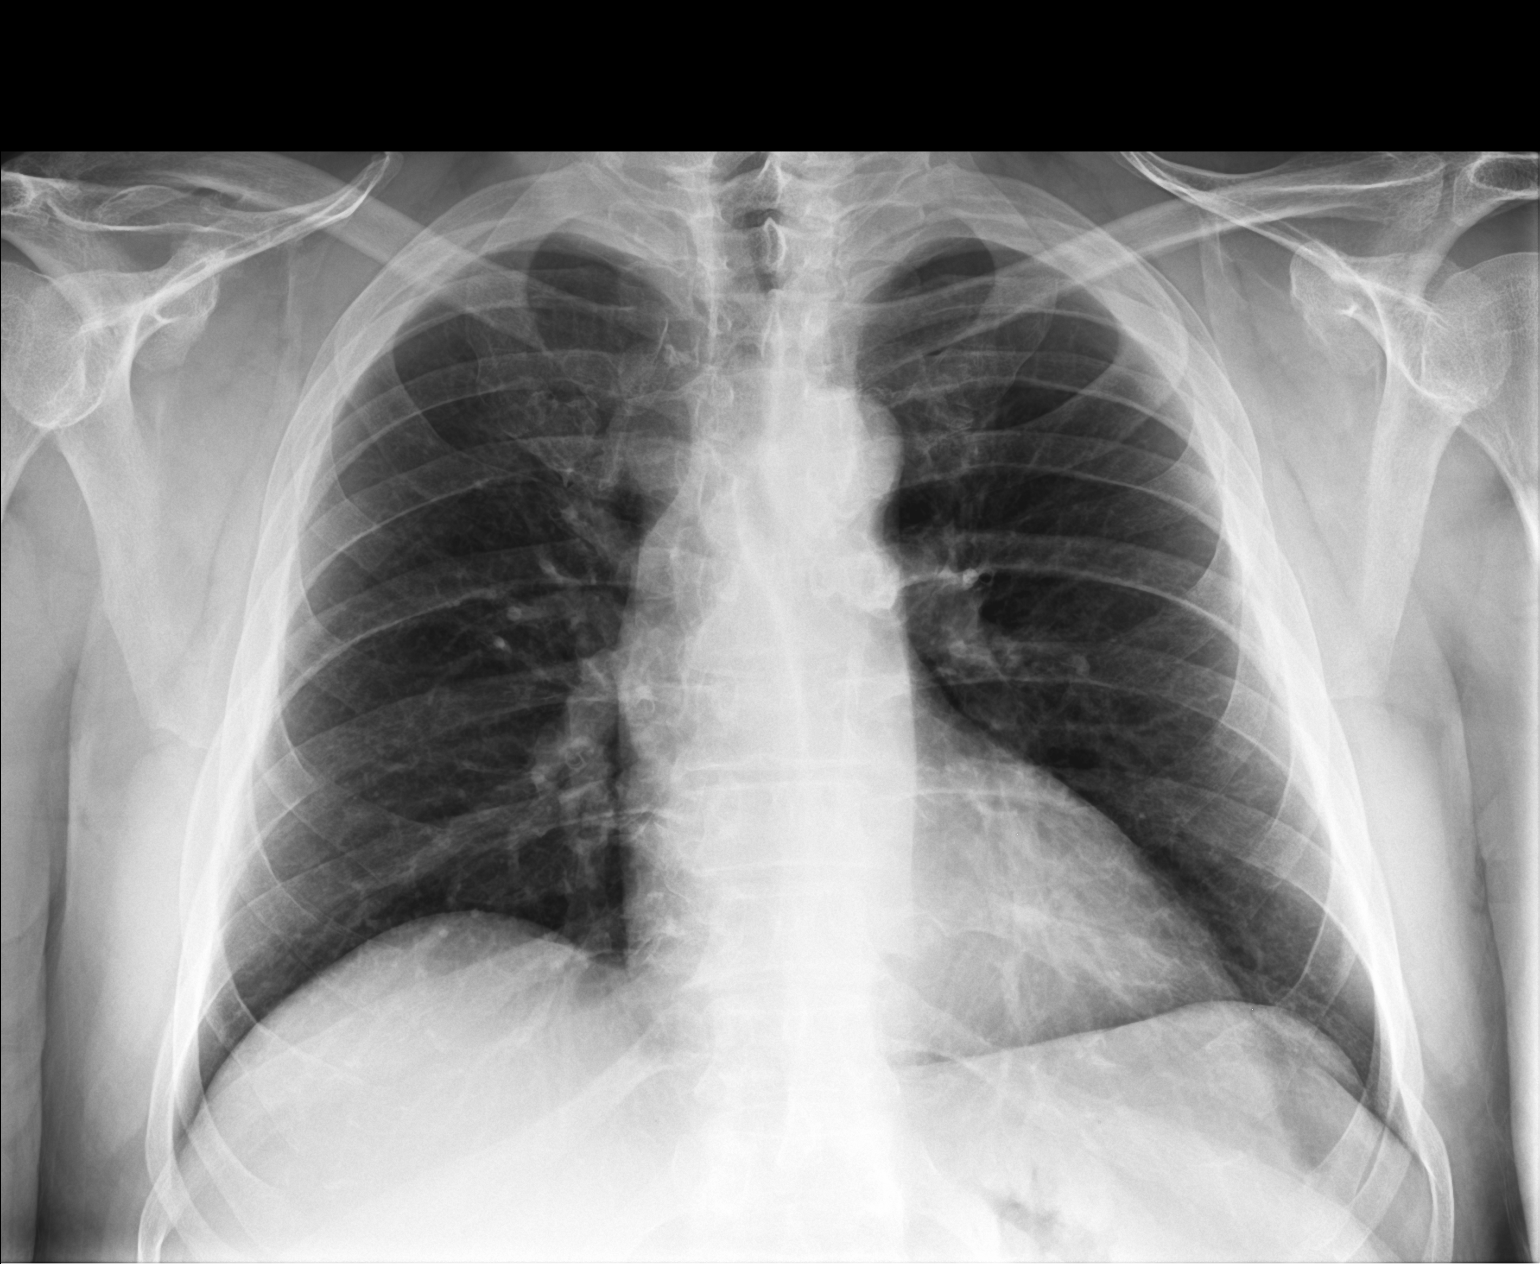

[chest lat]
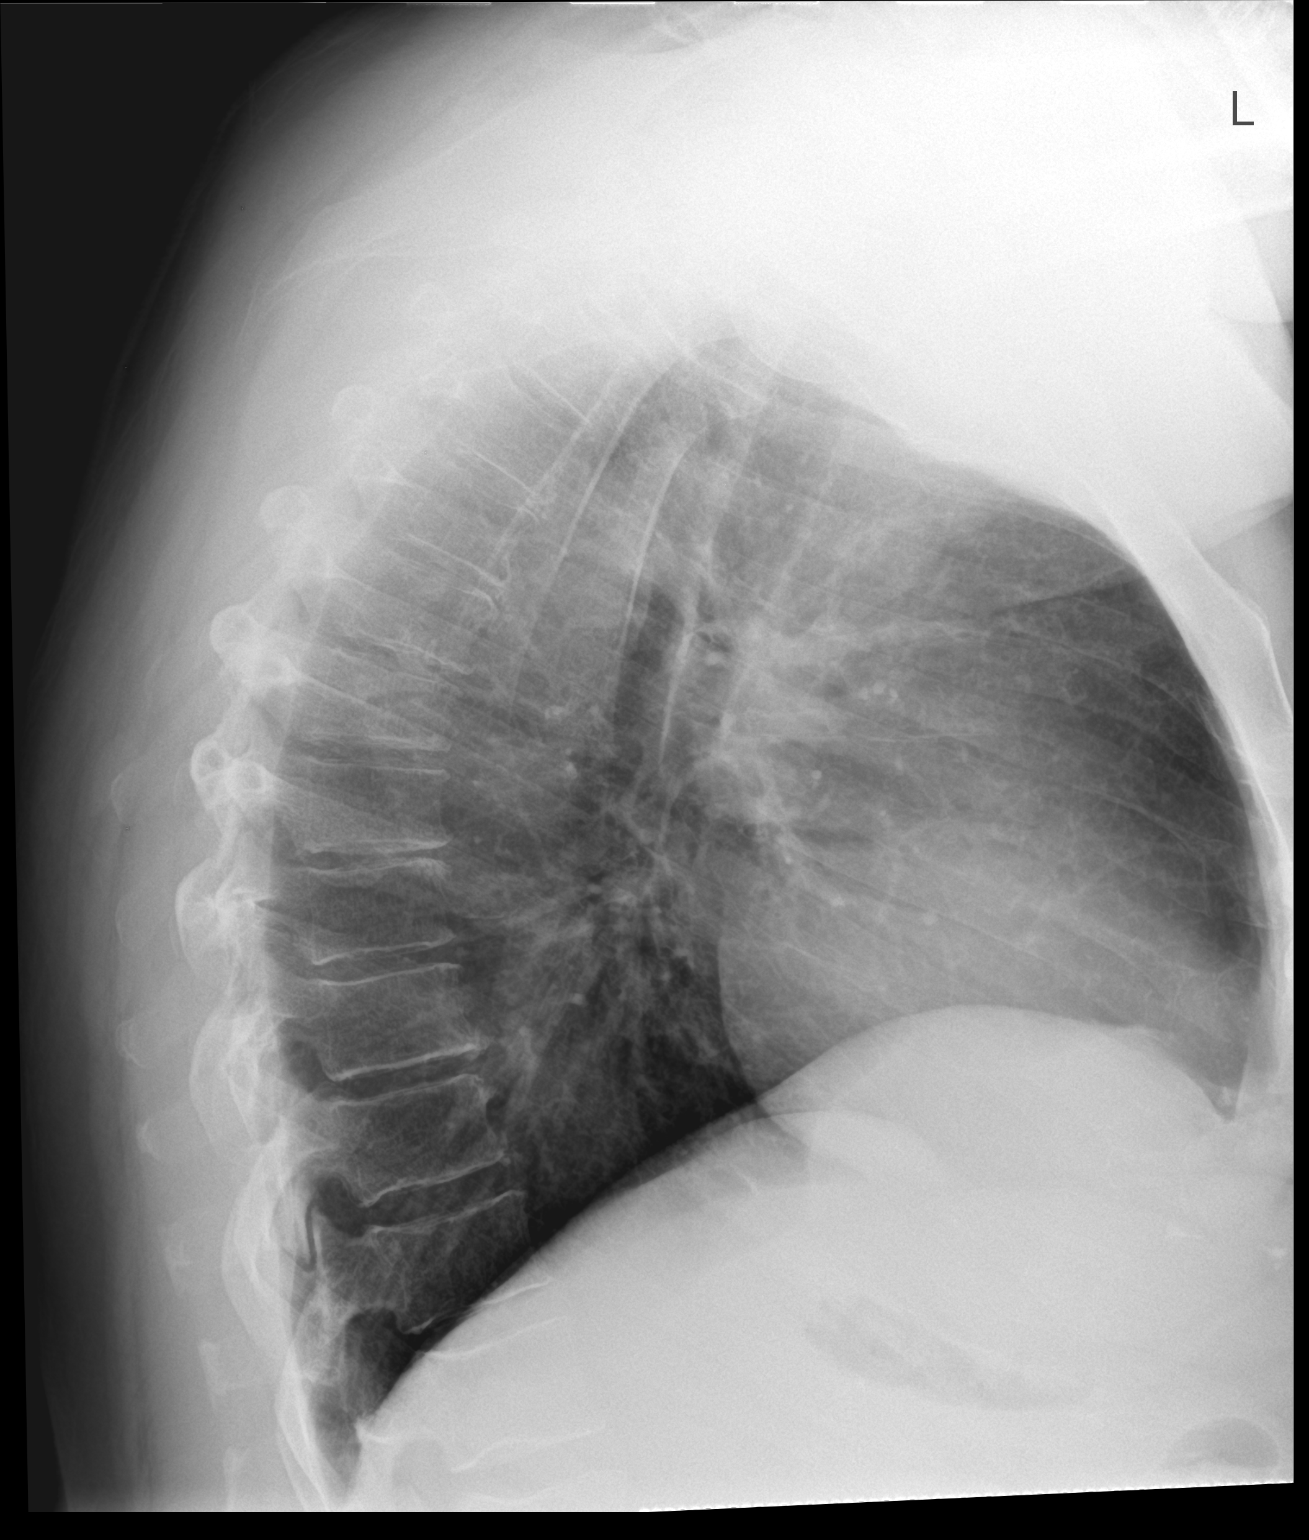

[2 of 2 positions shown; findings below may reference images not displayed]

DIAGNOSTIC STUDIES

EXAM

XR chest 2V

INDICATION

pre procedural exam
Pre-op clearance, no chest complaints. HB

TECHNIQUE

PA and lateral views of the chest.

COMPARISONS

August 07, 2018

FINDINGS

Cardiomediastinal silhouette is normal. Lungs are clear. Bony thorax is unchanged.

IMPRESSION

No evidence for active disease in the chest.

Tech Notes:

Pre-op clearance, no chest complaints. HB

## 2021-09-18 ENCOUNTER — Encounter: Admit: 2021-09-18 | Discharge: 2021-09-18 | Payer: BC Managed Care – HMO

## 2021-09-23 ENCOUNTER — Encounter: Admit: 2021-09-23 | Discharge: 2021-09-23 | Payer: BC Managed Care – HMO

## 2021-09-23 IMAGING — US ABDLM
1 series · 13 of 25 positions shown · non-contrast
Comparison: none

[Series 1: us abdomen limited · 13 of 82 slices shown]
[im 1/82]
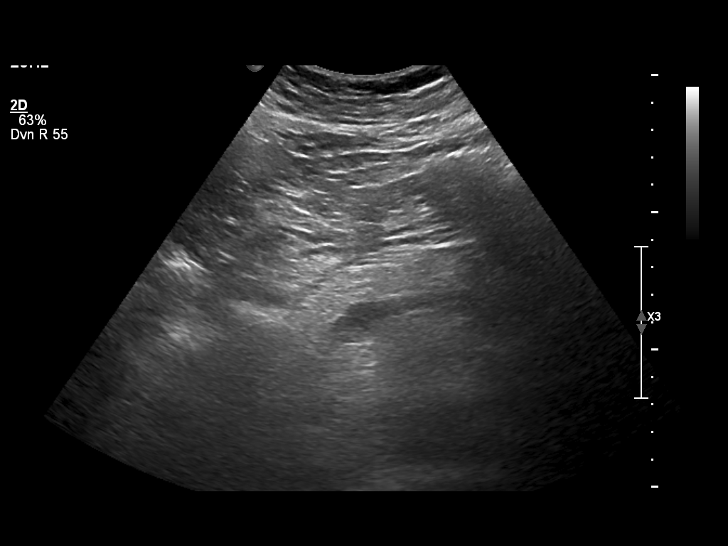
[im 7/82]
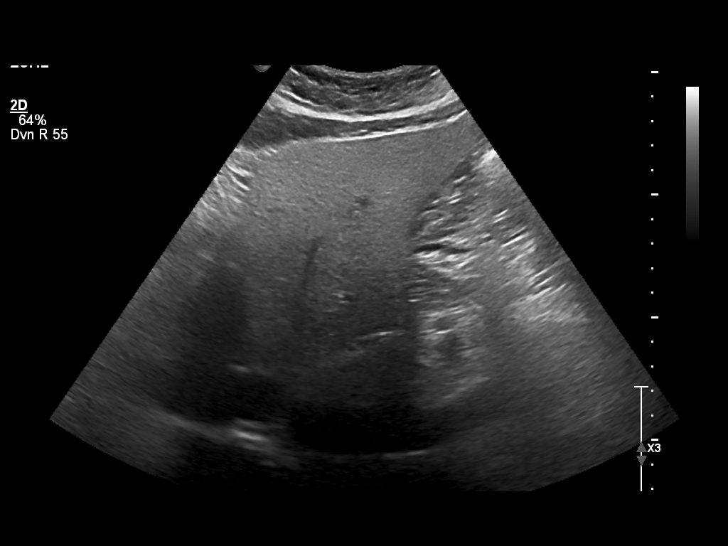
[im 14/82]
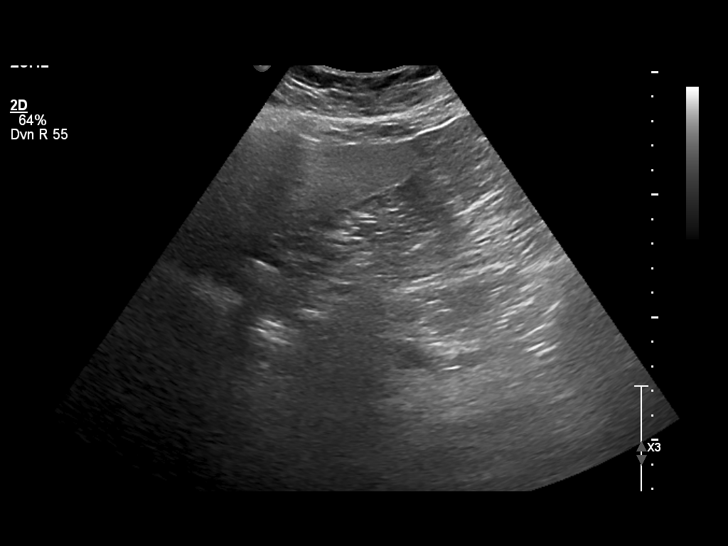
[im 21/82]
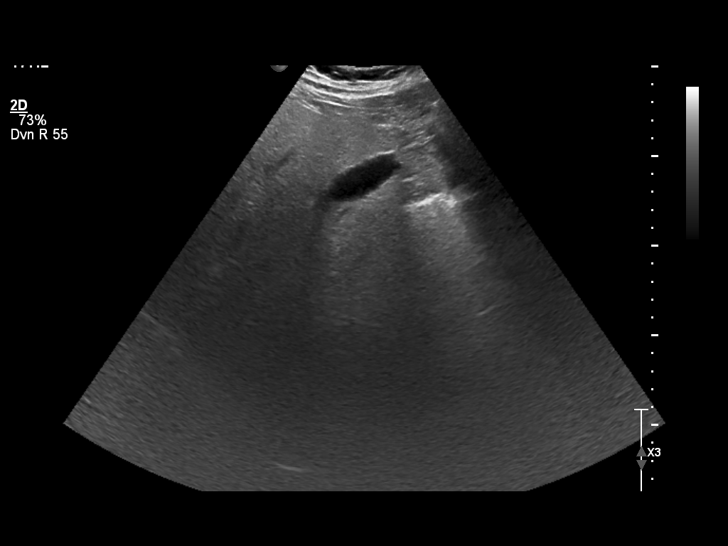
[im 28/82]
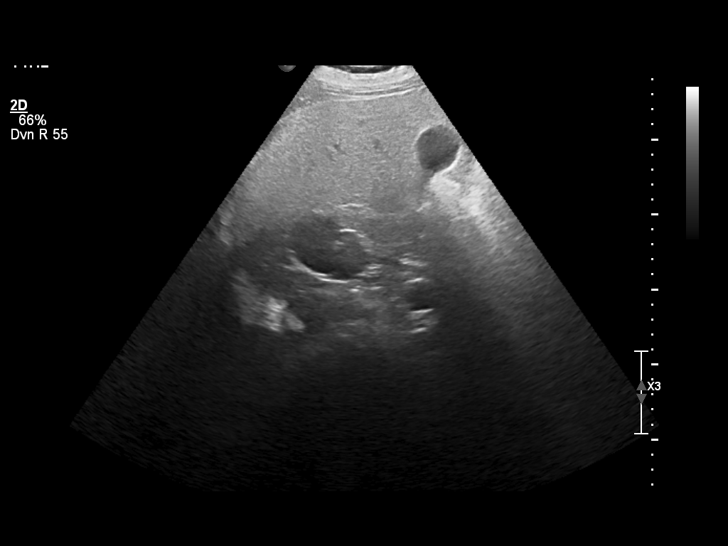
[im 34/82]
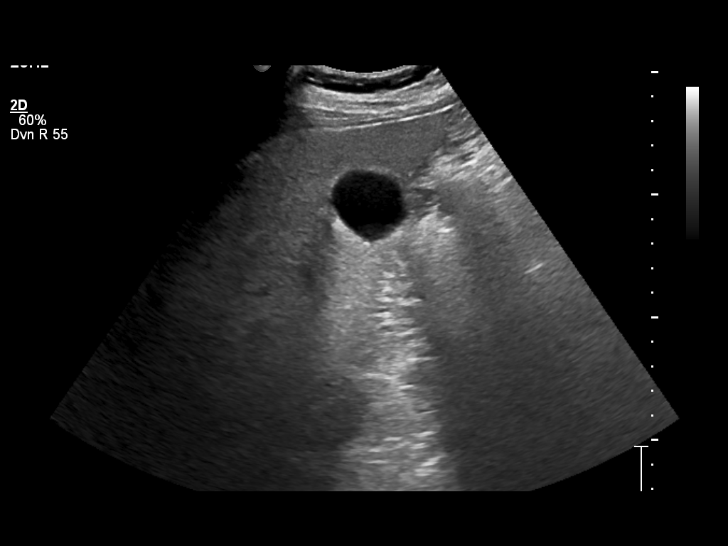
[im 41/82]
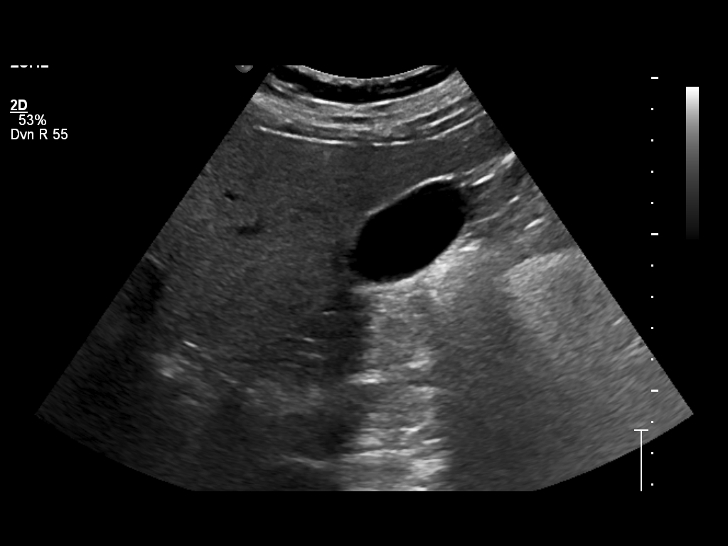
[im 48/82]
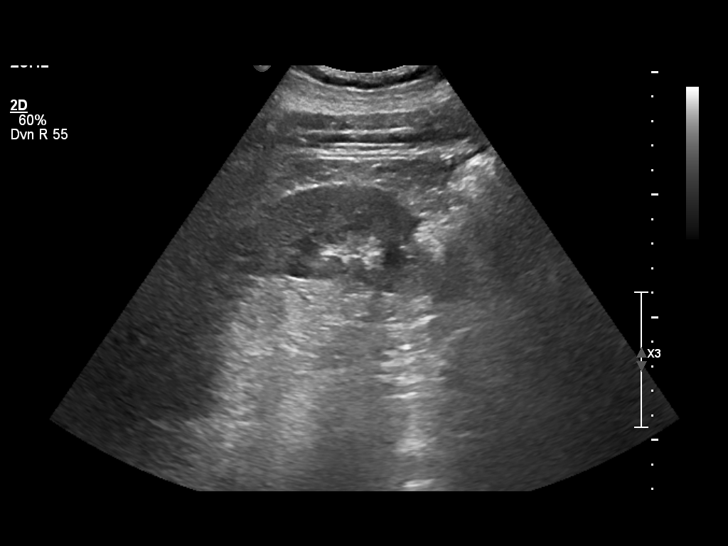
[im 55/82]
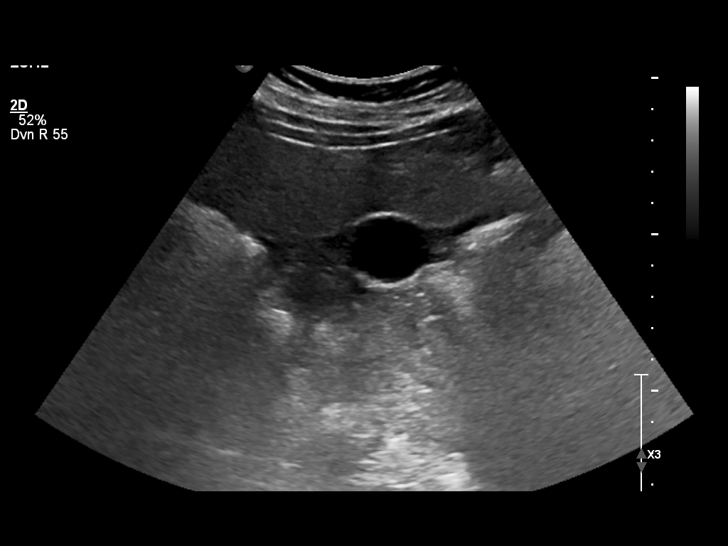
[im 61/82]
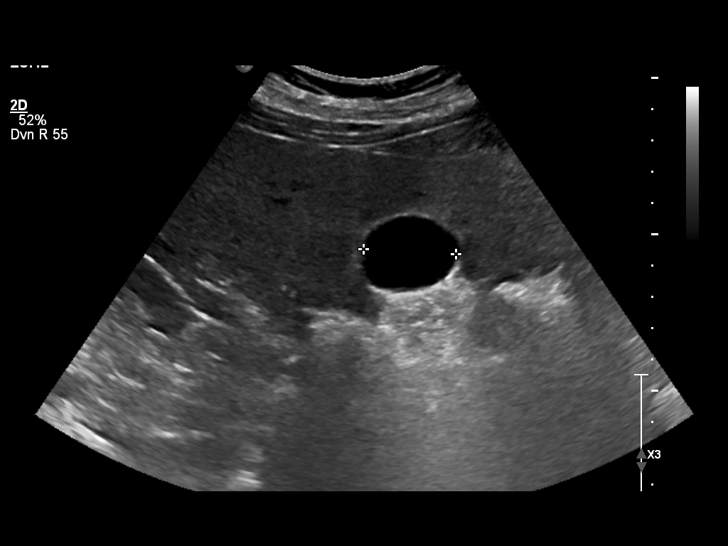
[im 68/82]
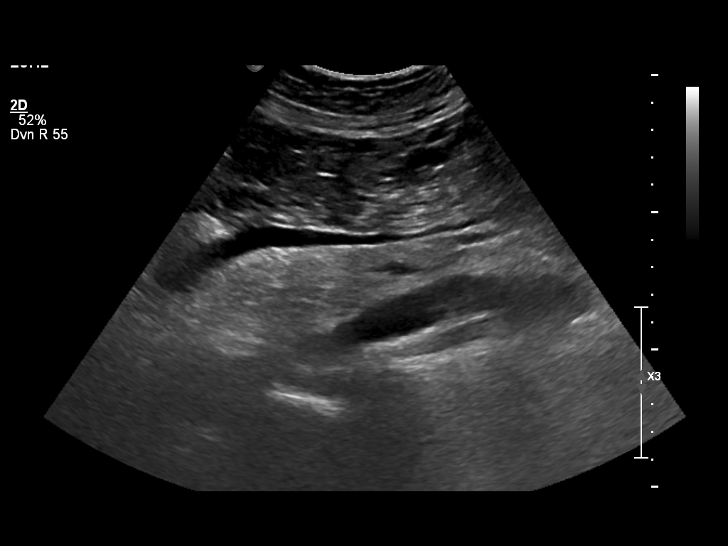
[im 75/82]
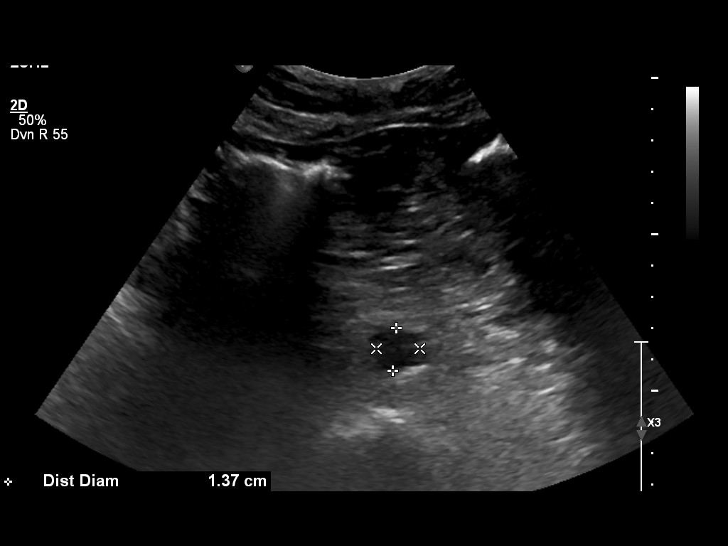
[im 82/82]
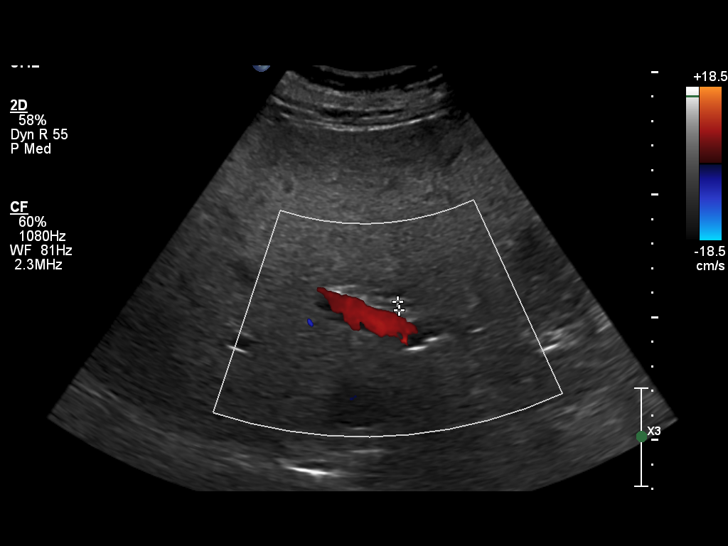

[13 of 25 positions shown; findings below may reference images not displayed]

EXAM

ULTRASOUND, ABDOMINAL, LIMITED; CPT 01017

INDICATION

RUQ pain and epigastric pain.. Elevated Lipase/transaminitis.

TECHNIQUE

Multiple static grayscale and color Doppler ultrasound images provided from a transabdominal
ultrasound.

COMPARISONS

There are no previous examinations available for comparison at the time of dictation.

FINDINGS

The liver appears mildly enlarged. Craniocaudad dimension was measured at 17.9 cm. There is diffuse
increased echotexture of the hepatic parenchyma consistent with hepatic steatosis.

There is no evidence of gallstones, pericholecystic fluid or focal gallbladder wall thickening. The
common bile duct measures  3.5 mm. There is no evidence of a sonographic Murphy sign.

The right kidney measures 11.8 x 5.4 x 5.6 cm. The right renal cortex measures 1.4 cm. There is no
evidence of hydronephrosis or obstructive uropathy. There is no evidence of nephrolithiasis.

The aorta and inferior vena cava are normal in caliber and contour. The pancreatic head and body
appear within normal limits. The pancreatic tail is obscured by overlying bowel gas.

IMPRESSION

Hepatomegaly. Hepatic steatosis. The pancreatic head and body appear within normal limits. The
pancreatic tail is obscured by overlying bowel gas. There is no evidence of cholelithiasis or acute
cholecystitis.

Tech Notes:

RUQ and epigastric pain; elevated lipase/transaminitis

## 2021-09-23 NOTE — Progress Notes
Patient with PMH of HLD, DM Type 2, HTN, obesity, and carotid artery disease is in ED with complaints of chest discomfort. He had the first episode at 04.00 and and the second at 05.00 am.  His blood pressure was elevated upon arrival, and he has received ASA 325 mg PO, Metoprolol 25 mg PO, and few NTG SL. Last week, patient had Regadenoson Stress test at [facility]. This study was abnormal. There is a small sized reversible perfusion defect involving the mid to basal inferior wall which is associated with a hypokinesis of the inferior wall. This is suggestive of ischemia. Left ventricular systolic function is mildly reduced. There is no transient ischemic dilation. Pulmonary to myocardial count ratio is normal. The pharmacologic ECG portion of the study is nondiagnostic for ischemia. Patient's primary provider is trying to establish this patient with [facility].

## 2021-11-06 IMAGING — US CARDUPBI
1 series · 14 of 16 positions shown · non-contrast
Comparison: none

[Series 1: us carotid duplex bi · 69 acquisitions, 14 frames shown]
[im 1/69]
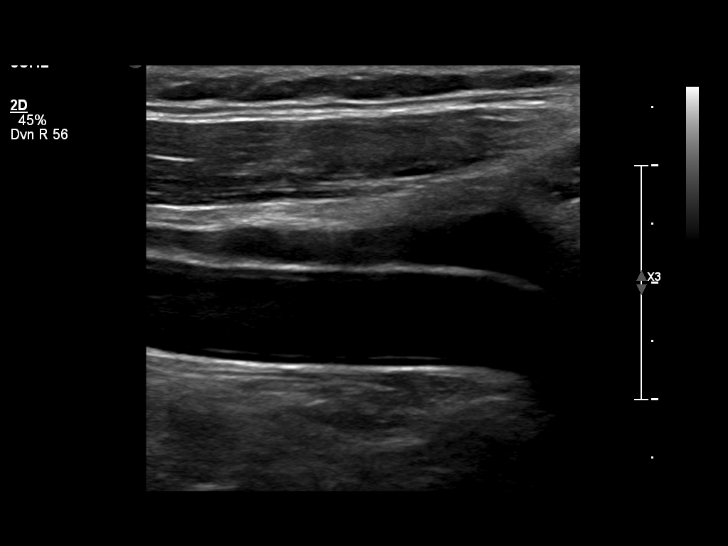
[im 5/69]
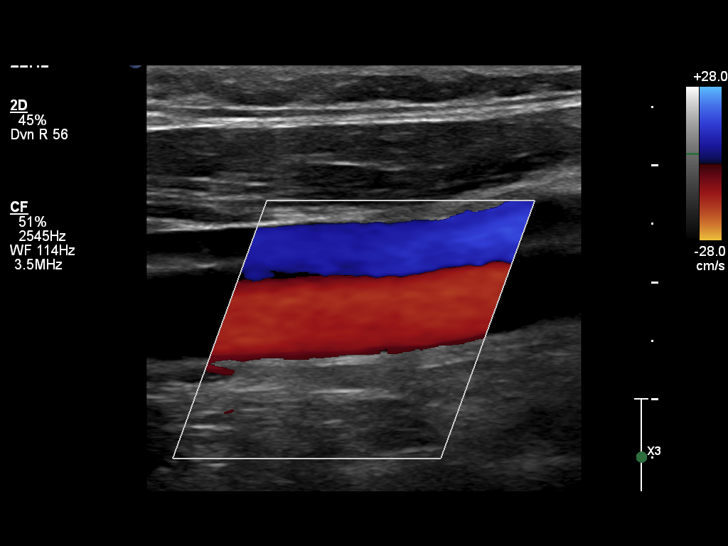
[im 10/69]
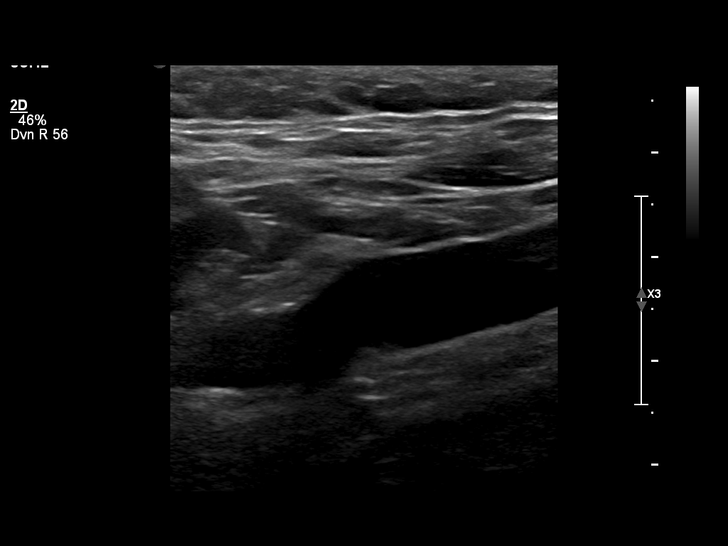
[im 19/69]
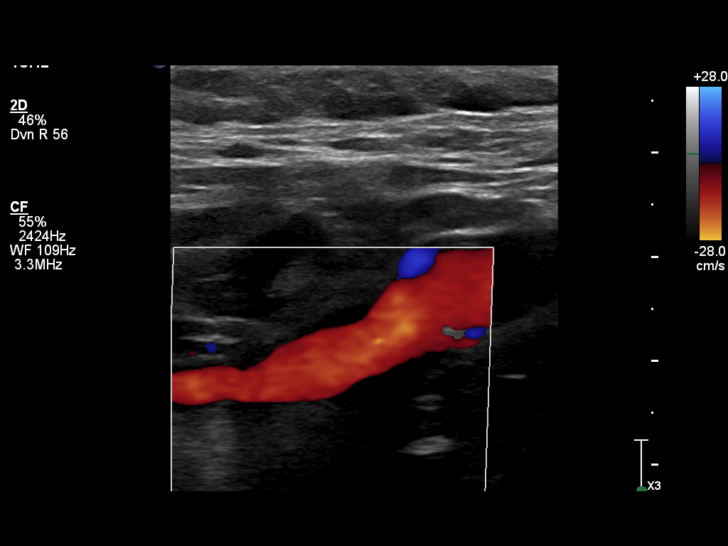
[im 23/69]
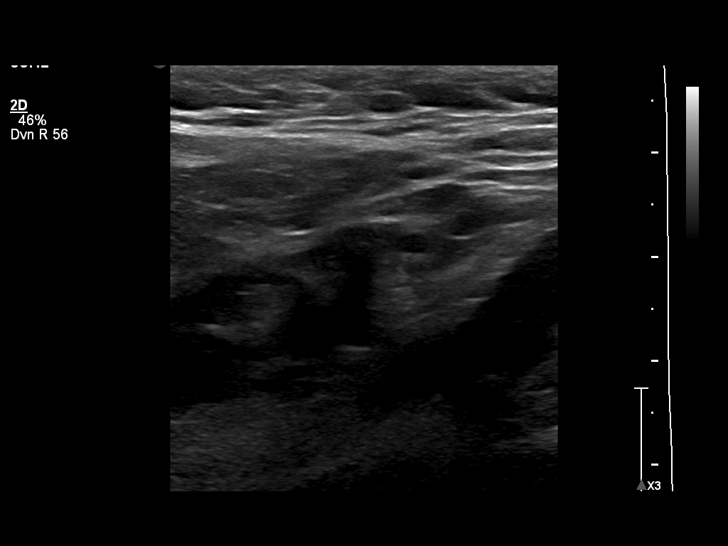
[im 28/69]
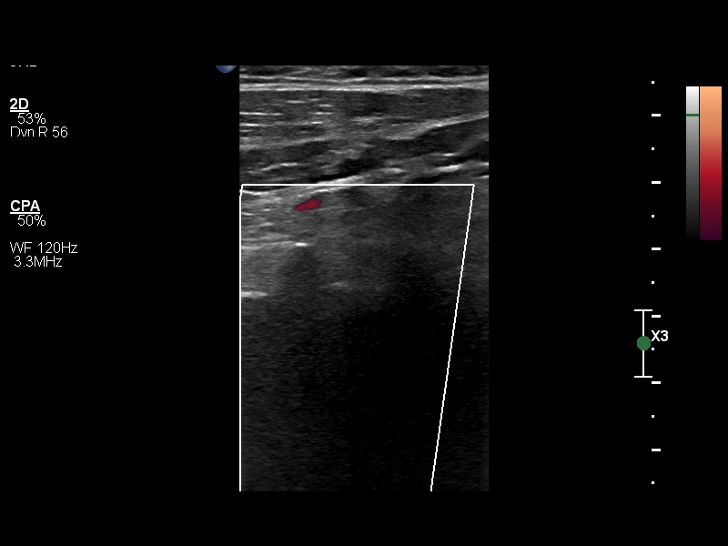
[im 32/69]
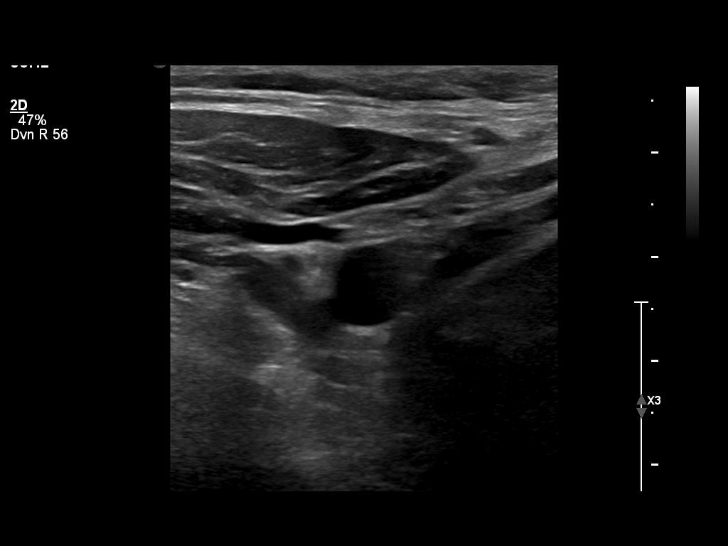
[im 37/69]
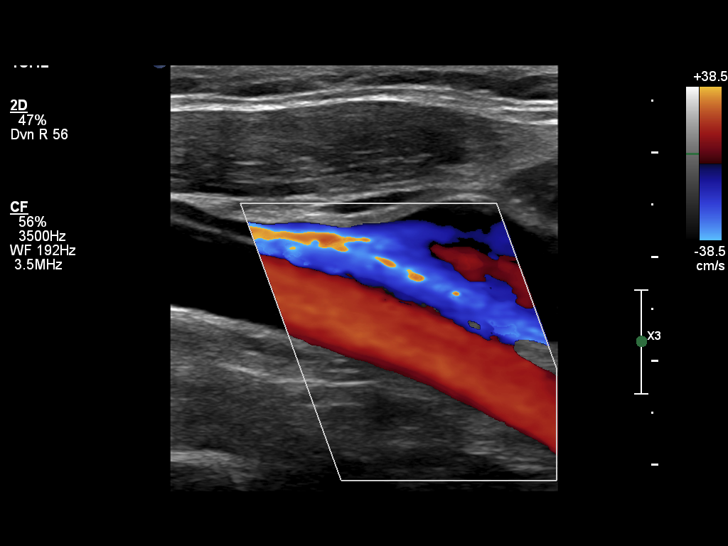
[im 41/69]
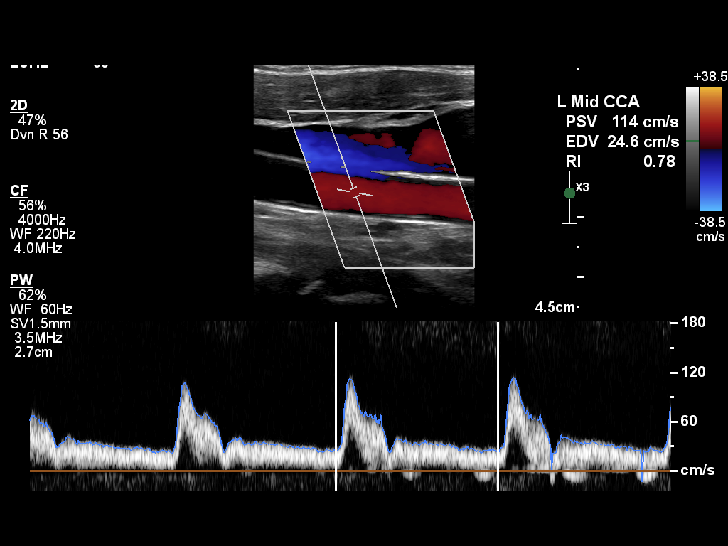
[im 46/69]
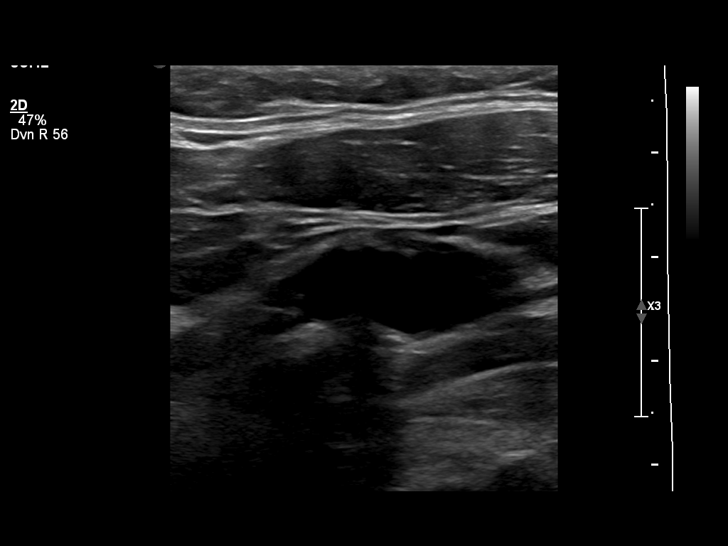
[im 55/69]
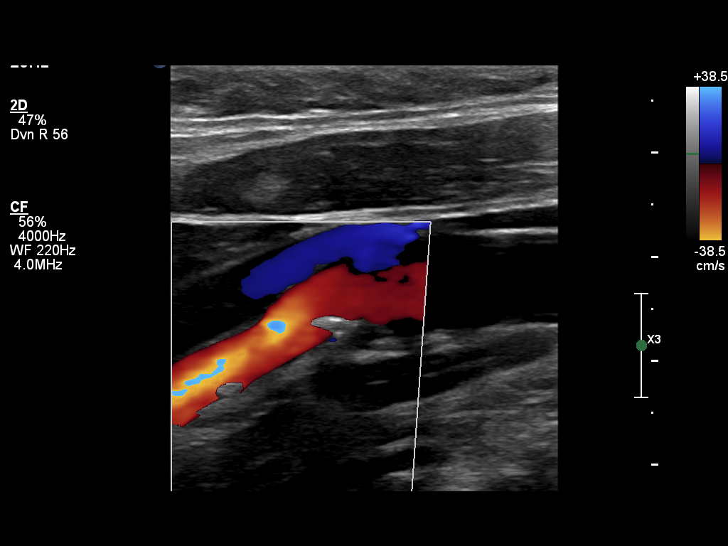
[im 59/69]
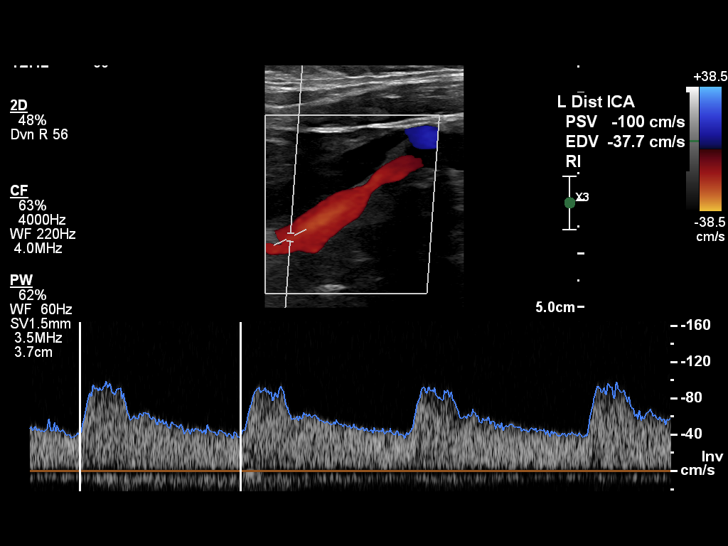
[im 64/69]
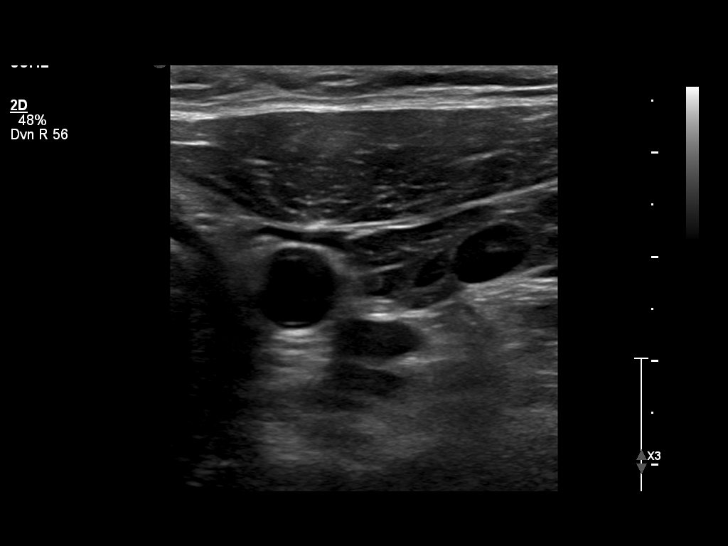
[im 69/69]
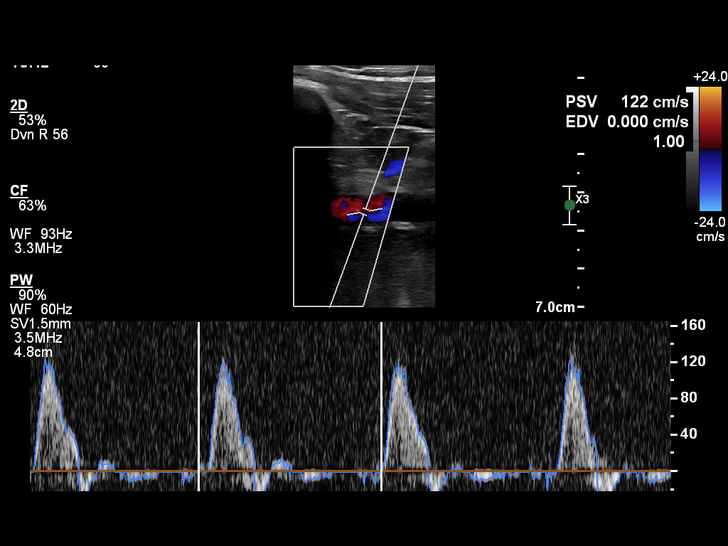

[14 of 16 positions shown; findings below may reference images not displayed]

EXAM

Carotid ultrasound bilateral

INDICATION

carotid stenosis
CAROTID STENOSIS; HX OF DIABETES AND HTN

TECHNIQUE

Grayscale and color Doppler

COMPARISONS

None available

FINDINGS

Right carotid: Mild plaque at the bulb.

Right ICA peak systolic velocity is 92 centimeters/sec. End-diastolic velocity is 19
centimeters/sec. ICA/CCA ratio is 0.87.

Right vertebral artery is not visualized. Normal flow in the subclavian artery.

Left carotid: [Moderate plaque greatest in the proximal ICA.]

Left ICA peak systolic velocity is [130] centimeters/sec. End-diastolic velocity is [48]
centimeters/sec. ICA/CCA ratio is [1.21].

There is [antegrade] flow in the left vertebral artery.

IMPRESSION

Right carotid velocities are consistent with 50-69 percent stenosis.

Left carotid velocities are consistent with less than 50 percent stenosis.

Right vertebral artery is not visualized.

Tech Notes:

CAROTID STENOSIS; HX OF DIABETES AND HTN

## 2022-09-24 ENCOUNTER — Encounter: Admit: 2022-09-24 | Discharge: 2022-09-24 | Payer: BC Managed Care – HMO

## 2022-09-27 ENCOUNTER — Encounter: Admit: 2022-09-27 | Discharge: 2022-09-27 | Payer: BC Managed Care – HMO

## 2022-10-04 ENCOUNTER — Encounter: Admit: 2022-10-04 | Discharge: 2022-10-04 | Payer: BC Managed Care – HMO

## 2022-10-05 ENCOUNTER — Encounter: Admit: 2022-10-05 | Discharge: 2022-10-05 | Payer: BC Managed Care – HMO

## 2022-10-06 ENCOUNTER — Encounter: Admit: 2022-10-06 | Discharge: 2022-10-06 | Payer: BC Managed Care – HMO

## 2022-10-06 NOTE — Patient Instructions
It was nice to see you today.  Thank you for choosing to visit our clinic.  Your time is important, and if you had to wait today, we do apologize.  Our goal is to run exactly on time.  However, on occasion, we get behind in clinic due to unexpected patient issues.  Thank you for your patience.    General Instructions:  Scheduling:  Our scheduling phone number is [phone].  Appointment Reminders on your cell phone:  Communication preferences can be managed in MyChart to ensure you receive important appointment notifications  How to reach our office:  Please send a MyChart message to the [facility] (directed to Dr. Cordell) or leave a voicemail for the nurse, Iyonnah Ferrante, at [phone].  Support for many chronic illnesses is available through [facility] at [email or web address] or [phone].    For help with MyChart:  please call [phone].    For more information on spinal conditions:  please visit [email or web address]     Again, thank you for coming in today.

## 2022-10-06 NOTE — Telephone Encounter
Patient LVM regarding records being sent. He said [facility] is sending records. He has his MRI's and CT on discs from [facility] in [place]. He will bring the discs to his appt. He said [facility] is hard to get records from and that they said they need a written request. This RN told him that I will send a written request for his records to be sent to [place]. Patient will be at his appointment 30 minutes early.

## 2022-10-06 NOTE — Telephone Encounter
LVM instructing patient to bring external imaging/records and plan to arrive 30-60 min early to register, upload records, complete questionnaires and xrays.

## 2022-10-07 ENCOUNTER — Encounter: Admit: 2022-10-07 | Discharge: 2022-10-07 | Payer: BC Managed Care – HMO

## 2022-10-11 ENCOUNTER — Encounter: Admit: 2022-10-11 | Discharge: 2022-10-11 | Payer: BC Managed Care – HMO

## 2022-10-11 ENCOUNTER — Ambulatory Visit: Admit: 2022-10-11 | Discharge: 2022-10-11 | Payer: BC Managed Care – HMO

## 2022-10-11 DIAGNOSIS — IMO0002 Ulcer: Secondary | ICD-10-CM

## 2022-10-11 DIAGNOSIS — M40204 Unspecified kyphosis, thoracic region: Secondary | ICD-10-CM

## 2022-10-11 DIAGNOSIS — M2550 Pain in unspecified joint: Secondary | ICD-10-CM

## 2022-10-11 DIAGNOSIS — Z981 Arthrodesis status: Secondary | ICD-10-CM

## 2022-10-11 DIAGNOSIS — M5136 Other intervertebral disc degeneration, lumbar region: Secondary | ICD-10-CM

## 2022-10-11 DIAGNOSIS — M5134 Other intervertebral disc degeneration, thoracic region: Secondary | ICD-10-CM

## 2022-10-11 DIAGNOSIS — E119 Type 2 diabetes mellitus without complications: Secondary | ICD-10-CM

## 2022-10-11 DIAGNOSIS — I10 Essential (primary) hypertension: Secondary | ICD-10-CM

## 2022-10-11 DIAGNOSIS — C801 Malignant (primary) neoplasm, unspecified: Secondary | ICD-10-CM

## 2022-10-11 DIAGNOSIS — I219 Acute myocardial infarction, unspecified: Secondary | ICD-10-CM
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
## 2022-11-17 ENCOUNTER — Encounter: Admit: 2022-11-17 | Discharge: 2022-11-17 | Payer: BC Managed Care – HMO

## 2023-04-12 ENCOUNTER — Encounter: Admit: 2023-04-12 | Discharge: 2023-04-12 | Payer: BC Managed Care – HMO

## 2023-04-15 ENCOUNTER — Encounter: Admit: 2023-04-15 | Discharge: 2023-04-15 | Payer: BC Managed Care – HMO

## 2023-04-15 NOTE — Telephone Encounter
04/15/23 - Records requested from Dr Ralph Leyden [phone] per Mardene Speak note below lkg    --Please request records from cardiologist Shelda Jakes, MD, Phone: [phone] - affiliated with [facility]   Thank you--

## 2023-06-21 ENCOUNTER — Encounter: Admit: 2023-06-21 | Discharge: 2023-06-21 | Payer: BC Managed Care – HMO

## 2023-08-12 ENCOUNTER — Encounter: Admit: 2023-08-12 | Discharge: 2023-08-12 | Payer: BC Managed Care – HMO

## 2023-08-30 ENCOUNTER — Encounter: Admit: 2023-08-30 | Discharge: 2023-08-30 | Payer: BLUE CROSS/BLUE SHIELD

## 2023-09-05 ENCOUNTER — Encounter: Admit: 2023-09-05 | Discharge: 2023-09-05 | Payer: BLUE CROSS/BLUE SHIELD

## 2023-09-08 ENCOUNTER — Encounter: Admit: 2023-09-08 | Discharge: 2023-09-08 | Payer: BLUE CROSS/BLUE SHIELD

## 2023-09-08 ENCOUNTER — Ambulatory Visit: Admit: 2023-09-08 | Discharge: 2023-09-08 | Payer: BLUE CROSS/BLUE SHIELD

## 2023-09-08 DIAGNOSIS — E785 Hyperlipidemia, unspecified: Secondary | ICD-10-CM

## 2023-09-08 DIAGNOSIS — I10 Essential (primary) hypertension: Secondary | ICD-10-CM

## 2023-09-08 DIAGNOSIS — I2510 Atherosclerotic heart disease of native coronary artery without angina pectoris: Secondary | ICD-10-CM

## 2023-09-08 DIAGNOSIS — E1169 Type 2 diabetes mellitus with other specified complication: Secondary | ICD-10-CM

## 2023-09-08 DIAGNOSIS — Z136 Encounter for screening for cardiovascular disorders: Secondary | ICD-10-CM

## 2023-09-08 NOTE — Progress Notes
 Date of Service: 09/08/2023    Aaron Galloway is a 58 y.o. male.       HPI   Aaron Galloway is followed for coronary artery disease, hypertension, hypercholesterolemia, and diabetes mellitus.  He reports that he has chronic back disease and is having pain control issues with chronic pain in his left hip, left buttocks and left thigh area.  Otherwise, Aaron Galloway reports that he has been stable from a cardiovascular perspective.  The patient reports no angina, congestive symptoms, palpitations, sensation of sustained forceful heart pounding, lightheadedness or syncope.  His exercise tolerance is limited to walking approximately 100 yards due to his chronic back disease and not from any cardiovascular symptoms. The patient reports no myalgias, claudication, bleeding abnormalities, or strokelike symptoms.  He is a retired Visual merchandiser.  Historically, review of his chart indicates that he was admitted to an outside hospital from 09/23/2021 to 09/25/2021 for chest discomfort.  The discharge note indicates that the troponin was normal, although a diagnosis of unstable angina is listed.  The patient underwent coronary angiography at the time and the second obtuse marginal which had an 80% stenosis was stented using a 3.5 mm x 24 mm drug-eluting stent.  The discharge note recommended dual antiplatelet therapy with aspirin and Plavix for a year although the patient has continued dual antiplatelet therapy.  The patient was also hospitalized from September 13, 2022 until September 16, 2023 for chest discomfort. The discharge note indicates that his troponin was negative and a diagnosis of atypical chest pain is listed.  Coronary angiography was performed on 09/14/2022 without intervention.  The patient actually reports no prior history of myocardial infarction and no prior history of transient ischemic attack or stroke.  He reports no history of congestive heart failure or significant renal insufficiency.  He has been tolerating treatment of Repatha without difficulty.  He has been working with his primary care physician for control of his diabetes and hypertension.  Indicates that usually his blood pressure is well-controlled.         Vitals:    09/08/23 0905   BP: (!) 140/95   BP Source: Arm, Left Upper   Pulse: 62   SpO2: 97%   O2 Device: None (Room air)   PainSc: Six   Weight: 95.3 kg (210 lb 3.2 oz)   Height: 180.3 cm (5' 11)     Body mass index is 29.32 kg/m?Aaron Galloway     Past Medical History  Patient Active Problem List    Diagnosis Date Noted    Coronary artery disease 08/23/2023      09/14/22 - Cardiac Cath at [facility] - 1.  Widely patent left circumflex artery stent. 2.  Moderate coronary artery disease involving the left anterior descending artery, second obtuse marginal branch, posterior descending artery, and posterolateral branch.   09/14/22 - Echocardiogram at [facility] - ? The quality of the study was technically difficult. The left ventricular systolic function is normal.  The visually estimated ejection fraction is between 60-65%. There is mildly increased left ventricular wall thickness.  Normal right ventricular cavity size.  The right ventricular systolic function is normal.  Aortic valve not well-visualized, likely trileaflet. There is focal calcification of the noncoronary cusp.  Trace aortic insufficiency. There is trace tricuspid valve regurgitation.  Indeterminate right atrial pressure. Peak TR velocity is 2.8 m/s.  Peak TR gradient is 31 mmHg. There is no evidence of pericardial effusion. Echo   04/30/22 - CT Calcium Score at [facility] -  Total Calcium score is calculated at 1460; left main artery AJ 130 score: 217; left anterior descending AJ 130 score: 357; Left circumflex AJ 130 score: 58; right coronary artery AJ 130 score 828.CT Calcium Score   09/24/21 - Cardiac Cath at [facility] -  Left Main: The LMCA is large in size and shows no significant disease. LAD: The Proximal LAD is large in size and mildly diseased. The Mid LAD is large in size and mildly diseased. The Distal LAD is medium in size and mildly diseased. The 1st Diagonal is medium in size and mildly diseased.  Circumflex: The Proximal Circumflex is large in size and mildly diseased. The Distal Circumflex is small in size and mildly diseased. The 1st Marginal is small in size and mildly diseased. The 2nd Marginal is large in size. There is an 80% lesion in   the 2nd Marginal. RCA: The Proximal RCA is large in size and mildly diseased. The Mid RCA is large in size and mildly diseased. The Distal RCA is large in size and mildly diseased. The Right PDA is medium in size and mildly diseased. The 1st RPL is medium in size and mildly diseased. Successful PCI of the 80% OM2 stenosis with a 3.5X24 Synergy XD DES  Cardiac Cath   09/17/21 - Regadenoson MPI at [facility] - This study is abnormal. There is a small sized reversible perfusion defect involving the mid to basal inferior wall which is associated with a hypokinesis of the inferior wall. This is suggestive of ischemia. Left ventricular systolic function is mildly reduced. There is no transient ischemic dilation. Pulmonary to myocardial count ratio is normal. The pharmacologic ECG portion of the study is nondiagnostic for ischemia.   09/09/21 - Echocardiogram at [facility] - Technically difficult study. LVEF=70%. Normal LV size with hyperdynamic systolic function. No regional wall abnormalities identified on the echo contrast images. Right ventricular size and function are normal. Moderate left atrial enlargement. The aortic valve is trileaflet with nonspecific focal thickening without impairment in leaflet mobility or reduction in excursion. Increased gradients and velocities are likely from hyperdynamic LV function not aortic valve stenosis. No pericardial effusion. Estimated pulmonary artery systolic pressure is 39 mmHg.      Hypertension 08/23/2023    Hyperlipidemia 08/23/2023     On repatha, pt is intolerant to statins.      Former smoker 08/23/2023    Type 2 diabetes mellitus (CMS-HCC) 08/23/2023    S/P coronary artery stent placement 08/23/2023     09/24/21 - Cardiac Cath at [facility] -  Left Main: The LMCA is large in size and shows no significant disease. LAD: The Proximal LAD is large in size and mildly diseased. The Mid LAD is large in size and mildly diseased. The Distal LAD is medium in size and mildly diseased. The 1st Diagonal is medium in size and mildly diseased.  Circumflex: The Proximal Circumflex is large in size and mildly diseased. The Distal Circumflex is small in size and mildly diseased. The 1st Marginal is small in size and mildly diseased. The 2nd Marginal is large in size. There is an 80% lesion in   the 2nd Marginal. RCA: The Proximal RCA is large in size and mildly diseased. The Mid RCA is large in size and mildly diseased. The Distal RCA is large in size and mildly diseased. The Right PDA is medium in size and mildly diseased. The 1st RPL is medium in size and mildly diseased. Successful PCI of the 80% OM2 stenosis with  a 3.5X24 Synergy XD DES  Cardiac Cath                         Review of Systems   Constitutional: Negative.   HENT: Negative.     Eyes: Negative.    Cardiovascular: Negative.    Respiratory: Negative.     Endocrine: Negative.    Hematologic/Lymphatic: Negative.    Skin: Negative.    Musculoskeletal:  Positive for back pain.   Gastrointestinal: Negative.    Genitourinary: Negative.    Neurological: Negative.    Psychiatric/Behavioral: Negative.     Allergic/Immunologic: Negative.        Physical Exam  GENERAL: The patient is well developed, well nourished, resting comfortably and in no distress.   HEENT: No abnormalities of the visible oro-nasopharynx, conjunctiva or sclera are noted.  NECK: There is no jugular venous distension. Carotids are palpable and without bruits. There is no thyroid enlargement.  Chest: Lung fields are clear to auscultation. There are no wheezes or crackles.  CV: There is a regular rhythm. The first and second heart sounds are normal.  A soft systolic ejection murmur is heard near the right upper sternal border.  There are no diastolic murmurs, gallops or rubs.  ABD: The abdomen is soft and supple with normal bowel sounds. There is no hepatosplenomegaly, ascites, tenderness, masses or bruits.  Neuro: There are no focal motor defects. Ambulation is normal. Cognitive function appears normal.  Ext: There is no edema or evidence of deep vein thrombosis. Peripheral pulses are satisfactory.    SKIN: There are no rashes and no cellulitis  PSYCH: The patient is calm, rationale and oriented.    Cardiovascular Studies  A twelve-lead ECG was obtained on 09/08/2023 reveals normal sinus rhythm with a heart rate of 60 bpm.  Right bundle branch block is noted.  A lot of his cardiovascular testing is listed above.  Cardiovascular Health Factors  Vitals BP Readings from Last 3 Encounters:   09/08/23 (!) 140/95   10/11/22 138/80   09/09/21 (!) 140/88     Wt Readings from Last 3 Encounters:   09/08/23 95.3 kg (210 lb 3.2 oz)   10/11/22 102.1 kg (225 lb)   09/09/21 100 kg (220 lb 7.4 oz)     BMI Readings from Last 3 Encounters:   09/08/23 29.32 kg/m?   10/11/22 31.83 kg/m?   09/09/21 30.75 kg/m?      Smoking Social History     Tobacco Use   Smoking Status Former    Current packs/day: 0.00    Average packs/day: 0.5 packs/day for 10.0 years (5.0 ttl pk-yrs)    Types: Cigarettes    Quit date: 05/19/2016    Years since quitting: 7.3   Smokeless Tobacco Never      Lipid Profile Cholesterol   Date Value Ref Range Status   04/04/2023 93  Final     HDL   Date Value Ref Range Status   04/04/2023 27 (L) >=40 Final     LDL   Date Value Ref Range Status   04/04/2023 32  Final     Triglycerides   Date Value Ref Range Status   04/04/2023 174 (H) <150 Final      Blood Sugar Hemoglobin A1C   Date Value Ref Range Status   07/04/2023 5.6  Final     Glucose   Date Value Ref Range Status   09/15/2022 207 (H) 74 - 106 Final 09/13/2022 272 (  H) 74 - 106 Final          Problems Addressed Today  Encounter Diagnoses   Name Primary?    Screening for heart disease Yes    Hypertension, unspecified type     Hyperlipidemia, unspecified hyperlipidemia type     Coronary artery disease involving native heart, unspecified vessel or lesion type, unspecified whether angina present     Type 2 diabetes mellitus with other specified complication, unspecified whether long term insulin use (CMS-HCC)        Assessment and Plan   Mr. Kinser reports that he is currently stable from a cardiovascular perspective and reports no angina or congestive symptoms.  Usually in patients with chronic coronary disease treated with prior percutaneous coronary intervention, dual antiplatelet therapy is recommended for 6 months followed by single antiplatelet therapy.  Alternatives for antiplatelet therapy were reviewed with Mr. Geerts and he wanted to stop clopidogrel and continue low-dose aspirin 81 mg daily.  He will continue to work with his primary care physician for control of his hypertension and diabetes mellitus.  I strongly recommend that he continue Repatha which has done an excellent job controlling his LDL cholesterol.  Cardiovascular risk factor management was reviewed in detail.  I have asked him to return for follow-up in 6 months time. The total time spent during this interview and exam with preparation and chart review was 60 minutes.         Current Medications (including today's revisions)   acetaminophen (TYLENOL EXTRA STRENGTH) 500 mg tablet Take two tablets by mouth at bedtime daily. Max of 4,000 mg of acetaminophen in 24 hours.    amLODIPine (NORVASC) 10 mg tablet Take one tablet by mouth daily.    aspirin EC (ASPIR-LOW) 81 mg tablet Take one tablet by mouth every 48 hours.    baclofen (LIORESAL) 20 mg tablet Take one tablet by mouth every 8 hours as needed.    CHOLEcalciferoL (vitamin D3) (DIALYVITE VITAMIN D) 125 mcg (5,000 unit) capsule Take one capsule by mouth daily.    cyanocobalamin (vitamin B-12) 1,000 mcg tablet Take one tablet by mouth daily.    gabapentin (NEURONTIN) 300 mg capsule Take one capsule by mouth twice daily.    lisinopriL (ZESTRIL) 20 mg tablet Take one tablet by mouth daily.    metFORMIN (GLUCOPHAGE) 1,000 mg tablet Take one tablet by mouth twice daily with meals.    metoprolol tartrate (LOPRESSOR) 50 mg tablet Take one tablet by mouth twice daily.    nitroglycerin (NITROSTAT) 0.4 mg tablet Place one tablet under tongue every 5 minutes as needed.    oxyCODONE-acetaminophen (PERCOCET) 5-325 mg tablet Take one tablet by mouth three times daily as needed for Pain.    OZEMPIC 0.25 mg or 0.5 mg (2 mg/3 mL) injection PEN Inject one-half mg under the skin every 7 days.    pantoprazole DR (PROTONIX) 40 mg tablet Take one tablet by mouth twice daily.    REPATHA SURECLICK 140 mg/mL injectable PEN Inject 1 mL under the skin every 14 days.

## 2023-09-08 NOTE — Patient Instructions
 Aspirin 81mg  daily  Stop Plavix  Continue other medications  Follow up in 6 months    Follow up as directed.  Call sooner if issues.  Call the [place] nursing line at [phone].  Leave a detailed message for the nurse in [facility] with how we can assist you and we will call you back.

## 2023-12-22 ENCOUNTER — Encounter: Admit: 2023-12-22 | Discharge: 2023-12-22 | Payer: BLUE CROSS/BLUE SHIELD

## 2023-12-27 ENCOUNTER — Encounter: Admit: 2023-12-27 | Discharge: 2023-12-27 | Payer: BLUE CROSS/BLUE SHIELD

## 2023-12-27 DIAGNOSIS — N2889 Other specified disorders of kidney and ureter: Principal | ICD-10-CM

## 2023-12-27 NOTE — Progress Notes
 Appointment Notes:  01/09/24 at 1100 with Dr. Jama    Referring Provider: Kyung Goodell, MD    Patient Care Team:  Kyung Goodell, MD as PCP - General (Family Medicine)  Debroah Elspeth NOVAK, MD (Cardiovascular Disease)    Location of Films:  [facility]    Location of Pathology:  N/A    Contact Summary:  Patient referred to [facility] urology for further evaluation and treatment options for left renal mass.    12/15/23- CT A/P W, IMPRESSION:  Subtle right renal upper pole 2 cm mass. The finding is indeterminate and the evaluation is limited without a renal protocol CT or MRI. Etiology such as RCCa is not excluded.     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     History:  Family History   Problem Relation Name Age of Onset    Heart problem Father Domonik Levario         4 bypasses in 1987 , currently has 14 stents     Past Medical History:    Cancer of skin    Coronary artery disease    Degenerative disc disease, lumbar    Degenerative disc disease, thoracic    DM (diabetes mellitus) (CMS-HCC)    Essential hypertension    GERD (gastroesophageal reflux disease)    Heart attack (CMS-HCC)    Joint pain    Other malignant neoplasm without specification of site    Ulcer     Surgical History:   Procedure Laterality Date    HX FUSION PROCEDURE      L5-S1 fused Sept. 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 He denied further questions at this time. Has navigator contact info.    Allergies documented in Epic:  Yes

## 2023-12-28 ENCOUNTER — Encounter: Admit: 2023-12-28 | Discharge: 2023-12-28 | Payer: BLUE CROSS/BLUE SHIELD

## 2023-12-29 ENCOUNTER — Encounter: Admit: 2023-12-29 | Discharge: 2023-12-29 | Payer: BLUE CROSS/BLUE SHIELD

## 2024-01-02 ENCOUNTER — Encounter: Admit: 2024-01-02 | Discharge: 2024-01-02 | Payer: BLUE CROSS/BLUE SHIELD

## 2024-01-03 NOTE — Patient Instructions
 It was nice to see you today.  Thank you for choosing to visit our clinic.  Your time is important, and if you had to wait today, we do apologize.  Our goal is to run exactly on time.  However, on occasion, we get behind in clinic due to unexpected patient issues.  Thank you for your patience.    General Instructions:  Scheduling:  Our scheduling phone number is [phone].  Appointment Reminders on your cell phone:  Communication preferences can be managed in MyChart to ensure you receive important appointment notifications  How to reach our office:  Please send a MyChart message to [facility] (directed to Dr. Shela Derby) or leave a voicemail for his MA, Hansika Leaming  at [phone].  Support for many chronic illnesses is available through [employer] at [email or web address] or [phone].    For help with MyChart:  please call [phone].    For more information on spinal conditions:  please visit [email or web address]     Again, thank you for coming in today.

## 2024-01-07 NOTE — Progress Notes
 Name: Aaron Galloway       MRN: [id number] DOB: 1966-02-16      AGE: 58 y.o.    Visit type: In-Person    Cancer Staging   No matching staging information was found for the patient.      Date of Service: 01/09/2024     Subjective:               Aaron Galloway is a very pleasant 57 y.o.     Onc Timeline    No problem history exists.      12/15/23- CT A/P W, IMPRESSION:  Subtle right renal upper pole 2 cm mass. The finding is indeterminate and the evaluation is limited without a renal protocol CT or MRI. Etiology such as RCCa is not excluded.      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      History:  Family History          Family History   Problem Relation Name Age of Onset    Heart problem Father Dewaine Morocho           4 bypasses in 1987 , currently has 14 stents         Past Medical History       Past Medical History:    Cancer of skin    Coronary artery disease    Degenerative disc disease, lumbar    Degenerative disc disease, thoracic    DM (diabetes mellitus) (CMS-HCC)    Essential hypertension    GERD (gastroesophageal reflux disease)    Heart attack (CMS-HCC)    Joint pain    Other malignant neoplasm without specification of site    Ulcer         Surgical history         Surgical History:   Procedure Laterality Date    HX FUSION PROCEDURE         L5-S1 fused Sept. 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 He denied further questions at this time. Has navigator contact info.          Objective:          acetaminophen (TYLENOL EXTRA STRENGTH) 500 mg tablet Take two tablets by mouth at bedtime daily. Max of 4,000 mg of acetaminophen in 24 hours.    amLODIPine (NORVASC) 10 mg tablet Take one tablet by mouth daily.    aspirin EC (ASPIR-LOW) 81 mg tablet Take one tablet by mouth every 48 hours.    baclofen (LIORESAL) 20 mg tablet Take one tablet by mouth every  8 hours as needed.    CHOLEcalciferoL (vitamin D3) (DIALYVITE VITAMIN D) 125 mcg (5,000 unit) capsule Take one capsule by mouth daily.    cyanocobalamin (vitamin B-12) 1,000 mcg tablet Take one tablet by mouth daily.    gabapentin (NEURONTIN) 300 mg capsule Take one capsule by mouth twice daily.    lisinopriL (ZESTRIL) 20 mg tablet Take one tablet by mouth daily.    metFORMIN (GLUCOPHAGE) 1,000 mg tablet Take one tablet by mouth twice daily with meals.    metoprolol tartrate (LOPRESSOR) 50 mg tablet Take one tablet by mouth twice daily.    nitroglycerin (NITROSTAT) 0.4 mg tablet Place one tablet under tongue every 5 minutes as needed.    oxyCODONE-acetaminophen (PERCOCET) 5-325 mg tablet Take one tablet by mouth three times daily as needed for Pain.    OZEMPIC 0.25 mg or 0.5 mg (2 mg/3 mL) injection PEN Inject one-half mg under the skin every 7 days.    pantoprazole DR (PROTONIX) 40 mg tablet Take one tablet by mouth twice daily.    REPATHA SURECLICK 140 mg/mL injectable PEN Inject 1 mL under the skin every 14 days.     There were no vitals filed for this visit.  There is no height or weight on file to calculate BMI.          ECOG performance status is 0, Fully active, able to carry on all pre-disease performance without restriction.    Physical Examination:  alert in NAD  Breathing unlabored  Neuro grossly intact        Assessment and Plan:  I have reviewed the notes from previous providers pertaining to the renal mass. I reviewed the images and reports of the cross-sectional radiology findings with the patient.  We discussed the implications of enhancing renal masses and the natural history of kidney cancer.  I explained that there is a small chance the renal mass is a benign lesion.  We went over an extensive discussion about the potential management of a small renal mass.  These include active surveillance with or without biopsy, biopsy of the mass and subsequent decision, ablative therapy with cryotherapy, RFA, or microwave, partial nephrectomy (robotic, laparoscopic, open), and radical nephrectomy.    After careful consideration, and through shared decision making, the patient is most comfortable with proceeding with IR biopsy and ablation. Appropriate lab orders (BMP, CBC, coags) have been placed along with referral to interventional radiology. Patient understands the risks, benefits, and alternatives to this approach. The patient understands that this may require more than one procedure. The patient also understands we will need follow-up imaging in the future including abdominal cross sectional imaging, chest imaging, and labs. Patient verbalizes understanding and would like to proceed.

## 2024-01-08 ENCOUNTER — Encounter: Admit: 2024-01-08 | Discharge: 2024-01-08 | Payer: BLUE CROSS/BLUE SHIELD

## 2024-01-09 ENCOUNTER — Ambulatory Visit: Admit: 2024-01-09 | Discharge: 2024-01-09 | Payer: BLUE CROSS/BLUE SHIELD

## 2024-01-09 ENCOUNTER — Encounter: Admit: 2024-01-09 | Discharge: 2024-01-09 | Payer: BLUE CROSS/BLUE SHIELD

## 2024-01-09 VITALS — BP 143/81 | HR 63 | Temp 97.70000°F | Wt 211.0 lb

## 2024-01-09 DIAGNOSIS — N2889 Other specified disorders of kidney and ureter: Principal | ICD-10-CM

## 2024-01-09 DIAGNOSIS — Z981 Arthrodesis status: Principal | ICD-10-CM

## 2024-01-09 DIAGNOSIS — M5417 Radiculopathy, lumbosacral region: Secondary | ICD-10-CM

## 2024-01-09 DIAGNOSIS — Z9889 Other specified postprocedural states: Secondary | ICD-10-CM

## 2024-01-09 NOTE — Progress Notes
 [facility] HISTORY AND PHYSICAL    Chief Complaint   Patient presents with    Lower Back - Follow Up, Buttocks pain     Feet feel cold, and toe feel like its bing pinch.     Right Leg - Pain    Left Leg - Pain    Follow Up                   Past Medical History:    Arthritis    Back pain    Cancer of skin    Constipation    Coronary artery disease    Degenerative disc disease, cervical    Degenerative disc disease, lumbar    Degenerative disc disease, thoracic    DM (diabetes mellitus) (CMS-HCC)    Essential hypertension    GERD (gastroesophageal reflux disease)    Heart attack (CMS-HCC)    Joint pain    Nerve injury    Other malignant neoplasm without specification of site    Spinal stenosis    Ulcer         Surgical History:   Procedure Laterality Date    COLONOSCOPY      HX ARTHROSCOPIC SURGERY  2024    Shoulder    HX FUSION PROCEDURE      L5-S1 fused Sept. 2018    HX HEART CATHETERIZATION      HX SKIN BIOPSY      KNEE SURGERY  1987    Both ,Arthroscopic only    SPINE SURGERY      SURGERY      UMBILICAL ARTERIAL CATH - BEDSIDE  June 24    Re-check and put stent in June of 23         Allergies[1]      Medications Ordered Prior to Encounter[2]      family history includes Diabetes in his father; Heart problem in his father.      Social History     Socioeconomic History    Marital status: Married   Tobacco Use    Smoking status: Former     Current packs/day: 0.00     Average packs/day: 0.5 packs/day for 10.0 years (5.0 ttl pk-yrs)     Types: Cigarettes     Quit date: 05/19/2016     Years since quitting: 7.6    Smokeless tobacco: Never   Vaping Use    Vaping status: Never Used   Substance and Sexual Activity    Alcohol use: Yes     Alcohol/week: 2.0 - 4.0 standard drinks of alcohol     Types: 2 - 4 Standard drinks or equivalent per week     Comment: Maybe 2 or 3 mixed drinks a week at most    Drug use: Never    Sexual activity: Yes     Partners: Female         Review of Systems   Constitutional: Negative.    HENT: Negative.     Eyes: Negative.    Respiratory: Negative.     Cardiovascular: Negative.    Gastrointestinal: Negative.    Endocrine: Negative.    Genitourinary: Negative.    Musculoskeletal:  Positive for back pain.   Skin: Negative.    Allergic/Immunologic: Negative.    Neurological: Negative.    Hematological: Negative.    Psychiatric/Behavioral: Negative.            HPI / PHYSICAL EXAM / RADIOGRAPHIC EVALUATION /  IMPRESSION / PLAN     SUBJECTIVE:  Aaron Galloway was initially evaluated on 11 October 2022.  Following the evaluation I recommended Aaron Galloway provide us  with the previous EMG he had obtained---he did not do so.  He returns today with his wife Aaron Galloway in attendance.  Doing continues to have pain in his low back which he indicates radiates into his buttocks along the posterior aspect of the thighs calves and resulted in numbness of both great toes his symptoms are more significant on the left than the right he is also experience calf muscular spasm in his left thigh and calf.  He has control of his bowel and bladder but he is constipated.  The furthest point has walked is approximately 440 yards and he uses a cane in his right hand.    Since last seen [name] has had the following pain management treatments a left sacroiliac joint injection 14 April 2023 bilateral L2-3 L3-4 and L4-5 facet joint injections on 15 June 2018 for a 25, a caudal steroid injection on 01 Sep 2023, and a repeat left sacroiliac joint on 22 September 2023.  [name] is scheduled to be seen by a vascular surgeon following today's visit.  I have again explained to try to find his old EMG so that we can compare to the new one.  Hopefully the new EMG will help determine the site of possible pain management treatment or the possibility of him needing to be further evaluated by neurology service or cardiovascular surgeon.      PHYSICAL EXAMINATION: Blood pressure (!) 143/81, pulse 63, temperature 36.5 ?C (97.7 ?F), weight 95.7 kg (211 lb), SpO2 99%.  Current physical exam reveals doing to be in moderate discomfort associated with his back he continues to use a cane in his right hand.  A full examination of the lumbosacral spine was not performed.  Examination of the lower extremities revealed normal motor function bilaterally he has painless passive range of motion of the hips knees and ankles.  Peripheral sensate peripheral sensation of the lower extremities was not fully evaluated today.       RADIOGRAPHIC EVALUATION: An AP lateral EOS film was obtained of his spine today and continues to show retained internal fixation L5-S1 with interbody fusion results patient has no evidence of spondylolisthesis no evidence of previous compression fractures of the entire spine.  The sacroiliac joints appear unremarkable to this examiner and I have reviewed the patient's previous CT scan scan of the lumbosacral spine with dye performed on 02 December 2023.      IMPRESSION:     1.  Pain lumbosacral spine with bilateral lower extremity radiculopathy-questionable etiology  2.  2 generalized arterial sclerosis  3.  Status post L5-S1 fusion with pedicle screws and interbody fusion device  4.  Schmorl nodes multiple levels of the lumbar spine and lower thoracic spine  5.  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  I had a lengthy discussion with the [name] and his wife Aaron Galloway I have recommended he have an EMG nerve conduction velocities performed as soon as we can get it scheduled prior to making further recommendations.  In addition I have asked doing to find the results of the previous EMG if at all possible and he is going to do so.  2.  Once the EMG has been completed I will call [name] and I can reach him at number [phone] to make further recommendations and to regards to further treatment as mentioned Lemond is scheduled to see a another physician in regards to his recent CT scan in regards to vascular abnormalities.  3.  4.  5.             Total time 45 minutes.    Note generated by electronic dictation, attempts have been made to review & correct the document for multiple errors.                 [1]   Allergies  Allergen Reactions    Doxycycline RASH    Ezetimibe MUSCLE PAIN, JOINT PAIN, NAUSEA ONLY and SEE COMMENTS     Muscle pain/achewhole body    Adhesive RASH    Hydrochlorothiazide JOINT PAIN, MUSCLE PAIN, NAUSEA ONLY, RASH, SEE COMMENTS and HIVES    Jardiance [Empagliflozin] RASH    Latex RASH    Furosemide SEE COMMENTS     body aches    Statins-Hmg-Coa Reductase Inhibitors JOINT PAIN and SEE COMMENTS     body aches   [2]   Current Outpatient Medications on File Prior to Visit   Medication Sig Dispense Refill    acetaminophen (TYLENOL EXTRA STRENGTH) 500 mg tablet Take two tablets by mouth at bedtime daily. Max of 4,000 mg of acetaminophen in 24 hours.      amLODIPine (NORVASC) 10 mg tablet Take one tablet by mouth daily.      aspirin EC (ASPIR-LOW) 81 mg tablet Take one tablet by mouth every 48 hours.      baclofen (LIORESAL) 20 mg tablet Take one tablet by mouth every 8 hours as needed.      celecoxib (CELEBREX) 50 mg capsule Take one capsule by mouth daily.      CHOLEcalciferoL (vitamin D3) (DIALYVITE VITAMIN D) 125 mcg (5,000 unit) capsule Take one capsule by mouth daily.      cyanocobalamin (vitamin B-12) 1,000 mcg tablet Take one tablet by mouth daily.      gabapentin (NEURONTIN) 300 mg capsule Take one capsule by mouth twice daily.      lisinopriL (ZESTRIL) 20 mg tablet Take one tablet by mouth daily.      metFORMIN (GLUCOPHAGE) 1,000 mg tablet Take one tablet by mouth twice daily with meals.      metoprolol tartrate (LOPRESSOR) 50 mg tablet Take one tablet by mouth twice daily.      nitroglycerin (NITROSTAT) 0.4 mg tablet Place one tablet under tongue every 5 minutes as needed.      oxyCODONE-acetaminophen (PERCOCET) 5-325 mg tablet Take one tablet by mouth three times daily as needed for Pain.      OZEMPIC 0.25 mg or 0.5 mg (2 mg/3 mL) injection PEN Inject one-half mg under the skin every 7 days.      pantoprazole DR (PROTONIX) 40 mg tablet Take one tablet by mouth twice daily.      REPATHA SURECLICK 140 mg/mL injectable PEN Inject 1 mL under the skin every 14 days.       No current facility-administered medications on file prior to visit.

## 2024-01-10 ENCOUNTER — Encounter: Admit: 2024-01-10 | Discharge: 2024-01-10 | Payer: BLUE CROSS/BLUE SHIELD

## 2024-01-16 ENCOUNTER — Encounter: Admit: 2024-01-16 | Discharge: 2024-01-16 | Payer: BLUE CROSS/BLUE SHIELD

## 2024-01-23 ENCOUNTER — Encounter: Admit: 2024-01-23 | Discharge: 2024-01-23 | Payer: BLUE CROSS/BLUE SHIELD

## 2024-01-23 ENCOUNTER — Ambulatory Visit: Admit: 2024-01-23 | Discharge: 2024-01-23 | Payer: BLUE CROSS/BLUE SHIELD

## 2024-01-23 VITALS — BP 145/77 | HR 61 | Ht 71.0 in | Wt 208.0 lb

## 2024-01-23 DIAGNOSIS — Z981 Arthrodesis status: Secondary | ICD-10-CM

## 2024-01-23 DIAGNOSIS — Z9889 Other specified postprocedural states: Secondary | ICD-10-CM

## 2024-01-23 DIAGNOSIS — M5417 Radiculopathy, lumbosacral region: Secondary | ICD-10-CM

## 2024-01-23 DIAGNOSIS — R202 Paresthesia of skin: Principal | ICD-10-CM

## 2024-01-23 NOTE — Progress Notes
 Chief Complaint:  Procedure (EMG BLE)      HPI:  Aaron Galloway is a 58 y.o.  male with past medical history significant for L5-S1 posterior spinal fusion 2018, L5 disc rupture with discectomy in 2016 and 2017, type 2 diabetes, hypertension, CAD status post MI, kidney cancer, and left shoulder rotator cuff injury who presents as a patient to EMG clinic with left greater than right leg pain.  The pain is significantly worse with prolonged standing and walking and much more constant in the left leg.  It is better with sitting.  He has some bilateral big toe tingling occasionally, but denies numbness or weakness bilateral lower extremities.  He has some cold feet bilaterally intermittently.  He also has left groin and buttocks pain is worse with walking or twisting his left leg in a certain way.  Denies bowel incontinence, bladder incontinence, or saddle anesthesia.  Patient has diabetes, denies history of thyroid issues, chemotherapy, radiation therapy.  Patient denies family history of neuropathy, Parkinson's, MS, RLS.      Past Medical History:    Arthritis    Back pain    Cancer of skin    Constipation    Coronary artery disease    Degenerative disc disease, cervical    Degenerative disc disease, lumbar    Degenerative disc disease, thoracic    DM (diabetes mellitus) (CMS-HCC)    Essential hypertension    GERD (gastroesophageal reflux disease)    Heart attack (CMS-HCC)    Joint pain    Nerve injury    Other malignant neoplasm without specification of site    Spinal stenosis    Ulcer       Surgical History:   Procedure Laterality Date    COLONOSCOPY      HX ARTHROSCOPIC SURGERY  2024    Shoulder    HX FUSION PROCEDURE      L5-S1 fused Sept. 2018    HX HEART CATHETERIZATION      HX SKIN BIOPSY      KNEE SURGERY  1987    Both ,Arthroscopic only    SPINE SURGERY      SURGERY      UMBILICAL ARTERIAL CATH - BEDSIDE  June 24    Re-check and put stent in June of 23       Patient Active Problem List    Diagnosis Date Noted    Lumbosacral radiculopathy 01/09/2024    S/P lumbar fusion 01/09/2024    History of melanoma in situ 12/27/2023    Left renal mass 12/27/2023    Coronary artery disease 08/23/2023    Hypertension 08/23/2023    Hyperlipidemia 08/23/2023    Former smoker 08/23/2023    Type 2 diabetes mellitus (CMS-HCC) 08/23/2023    S/P coronary artery stent placement 08/23/2023       Family History   Problem Relation Name Age of Onset    Heart problem Father Aaron Galloway         4 bypasses in 1987 , currently has 14 stents    Diabetes Father Aaron Galloway         Current Medications[1]    Allergies[2]    Social History[3]      Physical Exam:  BP (!) 145/77  - Pulse 61  - Ht 180.3 cm (5' 11)  - Wt 94.3 kg (208 lb)  - SpO2 100%  - BMI 29.01 kg/m?   General: Awake, alert, and oriented, no apparent distress, pleasant, and cooperative  Psychologic:  Mood is euthymic   HEENT: Normocephalic, atraumatic, moist membranes, anicteric sclera  Pulmonary: Nonlabored breathing  Cardiovascular: No edema in the limb examined  Skin: No increased erythema, warmth, rashes, or concerning skin lesions in the area examined  Neuro: Strength is 5/5 bilateral lower extremities.  2+ reflexes in patella and Achilles bilaterally.  Negative ankle clonus bilaterally.  Hoffmann's absent bilaterally.    MSK:  Logroll, FABER, and FADIR positive on the left for left groin and left buttocks pain.    Electrodiagnostic Laboratory Report    Impression:  NORMAL    1. There is no electrodiagnostic evidence of right or left lower extremity mononeuropathy  2. There is no evidence of right or left lumbosacral plexopathy  3. There is no evidence of left L2-S1 radiculopathy      Thank you for allowing me to perform electrodiagnostic testing on your patients.  A full EMG/NCS report will be scanned into O2/EPIC, including waveforms.  If you have any further questions or comments, please do not hesitate to call.    Aaron ONEIDA Brazen, MD  Physical Medicine and Rehabilitation         [1]   Current Outpatient Medications   Medication Sig Dispense Refill    acetaminophen (TYLENOL EXTRA STRENGTH) 500 mg tablet Take two tablets by mouth at bedtime daily. Max of 4,000 mg of acetaminophen in 24 hours.      amLODIPine (NORVASC) 10 mg tablet Take one tablet by mouth daily.      aspirin EC (ASPIR-LOW) 81 mg tablet Take one tablet by mouth every 48 hours.      baclofen (LIORESAL) 20 mg tablet Take one tablet by mouth every 8 hours as needed.      celecoxib (CELEBREX) 50 mg capsule Take one capsule by mouth daily.      CHOLEcalciferoL (vitamin D3) (DIALYVITE VITAMIN D) 125 mcg (5,000 unit) capsule Take one capsule by mouth daily.      cyanocobalamin (vitamin B-12) 1,000 mcg tablet Take one tablet by mouth daily.      gabapentin (NEURONTIN) 300 mg capsule Take one capsule by mouth twice daily.      lisinopriL (ZESTRIL) 20 mg tablet Take one tablet by mouth daily.      metFORMIN (GLUCOPHAGE) 1,000 mg tablet Take one tablet by mouth twice daily with meals.      metoprolol tartrate (LOPRESSOR) 50 mg tablet Take one tablet by mouth twice daily.      nitroglycerin (NITROSTAT) 0.4 mg tablet Place one tablet under tongue every 5 minutes as needed.      oxyCODONE-acetaminophen (PERCOCET) 5-325 mg tablet Take one tablet by mouth three times daily as needed for Pain.      OZEMPIC 0.25 mg or 0.5 mg (2 mg/3 mL) injection PEN Inject one-half mg under the skin every 7 days.      pantoprazole DR (PROTONIX) 40 mg tablet Take one tablet by mouth twice daily.      REPATHA SURECLICK 140 mg/mL injectable PEN Inject 1 mL under the skin every 14 days.       No current facility-administered medications for this visit.   [2]   Allergies  Allergen Reactions    Doxycycline RASH    Ezetimibe MUSCLE PAIN, JOINT PAIN, NAUSEA ONLY and SEE COMMENTS     Muscle pain/achewhole body    Adhesive RASH    Hydrochlorothiazide JOINT PAIN, MUSCLE PAIN, NAUSEA ONLY, RASH, SEE COMMENTS and HIVES    Jardiance [Empagliflozin] RASH    Latex RASH  Furosemide SEE COMMENTS     body aches    Statins-Hmg-Coa Reductase Inhibitors JOINT PAIN and SEE COMMENTS     body aches   [3]   Social History  Socioeconomic History    Marital status: Married   Tobacco Use    Smoking status: Former     Current packs/day: 0.00     Average packs/day: 0.5 packs/day for 10.0 years (5.0 ttl pk-yrs)     Types: Cigarettes     Quit date: 05/19/2016     Years since quitting: 7.6    Smokeless tobacco: Never   Vaping Use    Vaping status: Never Used   Substance and Sexual Activity    Alcohol use: Yes     Alcohol/week: 2.0 - 4.0 standard drinks of alcohol     Types: 2 - 4 Standard drinks or equivalent per week     Comment: Maybe 2 or 3 mixed drinks a week at most    Drug use: Never    Sexual activity: Yes     Partners: Female

## 2024-01-23 NOTE — Procedures
 Electrodiagnostic Laboratory Report    Impression:  NORMAL    1. There is no electrodiagnostic evidence of right or left lower extremity mononeuropathy  2. There is no evidence of right or left lumbosacral plexopathy  3. There is no evidence of left L2-S1 radiculopathy

## 2024-01-30 ENCOUNTER — Encounter: Admit: 2024-01-30 | Discharge: 2024-01-30 | Payer: BLUE CROSS/BLUE SHIELD

## 2024-01-31 ENCOUNTER — Encounter: Admit: 2024-01-31 | Discharge: 2024-01-31 | Payer: BLUE CROSS/BLUE SHIELD

## 2024-01-31 DIAGNOSIS — I10 Essential (primary) hypertension: Principal | ICD-10-CM

## 2024-01-31 DIAGNOSIS — M79606 Pain in leg, unspecified: Secondary | ICD-10-CM

## 2024-01-31 DIAGNOSIS — E1169 Type 2 diabetes mellitus with other specified complication: Secondary | ICD-10-CM

## 2024-01-31 DIAGNOSIS — I739 Peripheral vascular disease, unspecified: Secondary | ICD-10-CM

## 2024-01-31 DIAGNOSIS — I2510 Atherosclerotic heart disease of native coronary artery without angina pectoris: Secondary | ICD-10-CM

## 2024-01-31 NOTE — Progress Notes [1]
 Patient Prep Instruction for   Abdominal Duplex and Renal Duplex, and/or   Complete Lower Arterial Duplex Study with or without ABI's:    1. For 48 hours prior to your appointment, avoid the following:    Foods which are gas-producing, such as beans, cabbage, bran or other high fiber foods.    2. For 8 hours prior to your appointment:    No food or liquids  Medications may be taken with minimal amount of water  No smoking, tobacco, or gum chewing    3. If you are a diabetic, bring your medication and a snack with you.    TEST FINDINGS:    You will receive results of the test within 7 business days of its completion by telephone, unless arranged differently at the time of the procedure. If you have any questions concerning your test or if you do not hear from your [facility] physician/or nurse within 7 business days, please call us . [phone]

## 2024-01-31 NOTE — Telephone Encounter [36]
-----   Message from GORMAN Alexander, MD sent at 01/31/2024  1:08 PM CDT -----  Regarding: RE: leg pain  Marcey: He can get studies prior to his visit.  My strong recommendation is that he obtain them on a [facility] system.  I usually order PV 53 on the order sheet and the technician has a stepwise algorithm for how to proceed.  Thanks.  SBG  ----- Message -----  From: Mignon Senior  Sent: 01/31/2024  11:45 AM CDT  To: Elspeth KATHEE Alexander, MD  Subject: leg pain                                         Did you want to order any PV studies prior to Dec OV?      Patient called stating he was recommended to call cardiology regarding possibly ordering peripheral vascular studies due to leg pain and history of CAD.  Will review with Dr. Alexander       The pain is significantly worse with prolonged standing and walking and much more constant in the left leg. It is better with sitting. He has some bilateral big toe tingling occasionally, but denies numbness or weakness bilateral lower extremities. He has some cold feet bilaterally intermittent

## 2024-01-31 NOTE — Telephone Encounter [36]
 Patient called stating he was recommended to call cardiology regarding possibly ordering peripheral vascular studies due to leg pain and history of CAD.  Will review with Dr. Debroah       The pain is significantly worse with prolonged standing and walking and much more constant in the left leg. It is better with sitting. He has some bilateral big toe tingling occasionally, but denies numbness or weakness bilateral lower extremities. He has some cold feet bilaterally intermittently

## 2024-02-03 ENCOUNTER — Ambulatory Visit: Admit: 2024-02-03 | Discharge: 2024-02-03 | Payer: BLUE CROSS/BLUE SHIELD

## 2024-02-03 ENCOUNTER — Encounter: Admit: 2024-02-03 | Discharge: 2024-02-03 | Payer: BLUE CROSS/BLUE SHIELD

## 2024-02-03 MED ORDER — ARTIFICIAL TEARS (PF) SINGLE DOSE DROPS GROUP
OPHTHALMIC | 0 refills | Status: DC
Start: 2024-02-03 — End: 2024-02-03

## 2024-02-03 MED ORDER — FENTANYL CITRATE (PF) 50 MCG/ML IJ SOLN
INTRAVENOUS | 0 refills | Status: DC
Start: 2024-02-03 — End: 2024-02-03

## 2024-02-03 MED ORDER — PHENYLEPHRINE HCL IN 0.9% NACL 1 MG/10 ML (100 MCG/ML) IV SYRG
INTRAVENOUS | 0 refills | Status: DC
Start: 2024-02-03 — End: 2024-02-03

## 2024-02-03 MED ORDER — SUGAMMADEX 100 MG/ML IV SOLN
INTRAVENOUS | 0 refills | Status: DC
Start: 2024-02-03 — End: 2024-02-03

## 2024-02-03 MED ORDER — PROPOFOL INJ 10 MG/ML IV VIAL
INTRAVENOUS | 0 refills | Status: DC
Start: 2024-02-03 — End: 2024-02-03

## 2024-02-03 MED ORDER — ONDANSETRON HCL (PF) 4 MG/2 ML IJ SOLN
INTRAVENOUS | 0 refills | Status: DC
Start: 2024-02-03 — End: 2024-02-03

## 2024-02-03 MED ORDER — MIDAZOLAM 1 MG/ML IJ SOLN
INTRAVENOUS | 0 refills | Status: DC
Start: 2024-02-03 — End: 2024-02-03

## 2024-02-03 MED ORDER — ROCURONIUM 10 MG/ML IV SOLN
INTRAVENOUS | 0 refills | Status: DC
Start: 2024-02-03 — End: 2024-02-03

## 2024-02-03 MED ORDER — LIDOCAINE (PF) 200 MG/10 ML (2 %) IJ SYRG
INTRAVENOUS | 0 refills | Status: DC
Start: 2024-02-03 — End: 2024-02-03

## 2024-02-03 MED ORDER — SODIUM CHLORIDE 0.9% IV SOLP
INTRAVENOUS | 0 refills | Status: DC
Start: 2024-02-03 — End: 2024-02-03

## 2024-02-03 MED ORDER — DEXAMETHASONE SODIUM PHOSPHATE 4 MG/ML IJ SOLN
INTRAVENOUS | 0 refills | Status: DC
Start: 2024-02-03 — End: 2024-02-03

## 2024-02-03 MED ADMIN — SODIUM CHLORIDE 0.9 % IJ SOLN [7319]: 50 mL | INTRAVENOUS | @ 16:00:00 | Stop: 2024-02-03 | NDC 00409488820

## 2024-02-03 MED ADMIN — ACETAMINOPHEN 500 MG PO TAB [102]: 1000 mg | ORAL | @ 17:00:00 | Stop: 2024-02-03 | NDC 00904673061

## 2024-02-03 MED ADMIN — FENTANYL CITRATE (PF) 50 MCG/ML IJ SOLN [3037]: 25 ug | INTRAVENOUS | @ 17:00:00 | Stop: 2024-02-03 | NDC 00641602701

## 2024-02-03 MED ADMIN — CEFTRIAXONE INJ 1GM IVP [210253]: 1 g | INTRAVENOUS | @ 15:00:00 | Stop: 2024-02-03 | NDC 00781320895

## 2024-02-03 MED ADMIN — IOHEXOL 350 MG IODINE/ML IV SOLN [81210]: 100 mL | INTRAVENOUS | @ 16:00:00 | Stop: 2024-02-03 | NDC 00407141491

## 2024-02-03 MED ADMIN — FENTANYL CITRATE (PF) 50 MCG/ML IJ SOLN [3037]: 50 ug | INTRAVENOUS | @ 17:00:00 | Stop: 2024-02-03 | NDC 00641602701

## 2024-02-08 ENCOUNTER — Encounter: Admit: 2024-02-08 | Discharge: 2024-02-08 | Payer: BLUE CROSS/BLUE SHIELD

## 2024-02-13 ENCOUNTER — Encounter: Admit: 2024-02-13 | Discharge: 2024-02-13 | Payer: BLUE CROSS/BLUE SHIELD

## 2024-02-14 ENCOUNTER — Encounter: Admit: 2024-02-14 | Discharge: 2024-02-14 | Payer: BLUE CROSS/BLUE SHIELD

## 2024-02-15 ENCOUNTER — Encounter: Admit: 2024-02-15 | Discharge: 2024-02-15 | Payer: BLUE CROSS/BLUE SHIELD

## 2024-02-15 ENCOUNTER — Ambulatory Visit: Admit: 2024-02-15 | Discharge: 2024-02-15 | Payer: BLUE CROSS/BLUE SHIELD

## 2024-02-16 ENCOUNTER — Encounter: Admit: 2024-02-16 | Discharge: 2024-02-16 | Payer: BLUE CROSS/BLUE SHIELD

## 2024-03-13 ENCOUNTER — Encounter: Admit: 2024-03-13 | Discharge: 2024-03-13 | Payer: BLUE CROSS/BLUE SHIELD

## 2024-03-13 VITALS — BP 138/84 | HR 73 | Ht 71.0 in | Wt 218.4 lb

## 2024-03-13 DIAGNOSIS — E785 Hyperlipidemia, unspecified: Secondary | ICD-10-CM

## 2024-03-13 DIAGNOSIS — Z136 Encounter for screening for cardiovascular disorders: Principal | ICD-10-CM

## 2024-03-13 DIAGNOSIS — I2510 Atherosclerotic heart disease of native coronary artery without angina pectoris: Secondary | ICD-10-CM

## 2024-03-13 DIAGNOSIS — I10 Essential (primary) hypertension: Secondary | ICD-10-CM

## 2024-03-13 DIAGNOSIS — E1169 Type 2 diabetes mellitus with other specified complication: Secondary | ICD-10-CM

## 2024-03-13 NOTE — Patient Instructions [37]
 Thank you for visiting our office today.    We would like to make the following medication adjustments:  NONE       Otherwise continue the same medications as you have been doing.          We will be pursuing the following tests after your appointment today:            We will plan to see you back in 12 months.  Please call us in the meantime with any questions or concerns.        Please allow 5-7 business days for our providers to review your results. All normal results will go to MyChart. If you do not have Mychart, it is strongly recommended to get this so you can easily view all your results. If you do not have mychart, we will attempt to call you once with normal lab and testing results. If we cannot reach you by phone with normal results, we will send you a letter.  If you have not heard the results of your testing after one week please give [place] a call.       Your Cardiovascular Medicine [place] Team Brett Canales, Pilar Jarvis, Shawna [place], and [place])  phone number is [phone].

## 2024-03-13 NOTE — Progress Notes [1]
 Date of Service: 03/13/2024    Aaron Galloway is a 58 y.o. male.       HPI   Aaron Galloway is followed for coronary artery disease, hypertension, hypercholesterolemia, and diabetes mellitus.  He reports that he has chronic back disease and is having pain control issues with chronic pain in his left hip, left buttocks and left thigh area.  Interestingly enough, Aaron Galloway reports that a CT scan was obtained while evaluating him for back and hip discomfort that revealed a right renal malignancy.  On February 03, 2024 he underwent microwave ablation.  He has received a left hip injection which has helped considerably.  He is still having some back discomfort.  His left shoulder rotator cuff injury improved significantly after his microwave ablation February 03, 2024.  He has been tolerating treatment of Repatha without difficulty.  He has been working with his primary care physician for control of his diabetes and hypertension.  Indicates that usually his blood pressure is well-controlled. Otherwise, Aaron Galloway reports that he has been stable from a cardiovascular perspective.  The patient reports no angina, congestive symptoms, palpitations, sensation of sustained forceful heart pounding, lightheadedness or syncope.  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  His exercise tolerance is limited to walking approximately 100 yards due to his chronic back disease and not from any cardiovascular symptoms. The patient reports no myalgias, claudication, bleeding abnormalities, or strokelike symptoms.    Historically, review of his chart indicates that he was admitted to an outside hospital from 09/23/2021 to 09/25/2021 for chest discomfort.  The discharge note indicates that the troponin was normal, although a diagnosis of unstable angina is listed.  The patient underwent coronary angiography at the time and the second obtuse marginal which had an 80% stenosis was stented using a 3.5 mm x 24 mm drug-eluting stent.  The discharge note recommended dual antiplatelet therapy with aspirin and Plavix for a year although the patient has continued dual antiplatelet therapy.  The patient was also hospitalized from September 13, 2022 until September 16, 2023 for chest discomfort. The discharge note indicates that his troponin was negative and a diagnosis of atypical chest pain is listed.  Coronary angiography was performed on 09/14/2022 without intervention.  The patient actually reports no prior history of myocardial infarction and no prior history of transient ischemic attack or stroke.  He reports no history of congestive heart failure or significant renal insufficiency.       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  Widely patent left circumflex artery stent. 2.  Moderate coronary artery disease involving the left anterior descending artery, second obtuse marginal branch, posterior descending artery, and posterolateral branch.   09/14/22 -  Echocardiogram at [facility] - ? The quality of the study was technically difficult. The left ventricular systolic function is normal.  The visually estimated ejection fraction is between 60-65%. There is mildly increased left ventricular wall thickness.  Normal right ventricular cavity size.  The right ventricular systolic function is normal.  Aortic valve not well-visualized, likely trileaflet. There is focal calcification of the noncoronary cusp.  Trace aortic insufficiency. There is trace tricuspid valve regurgitation.  Indeterminate right atrial pressure. Peak TR velocity is 2.8 m/s.  Peak TR gradient is 31 mmHg. There is no evidence of pericardial effusion. Echo   04/30/22 - CT Calcium Score at [facility] - Total Calcium score is calculated at 1460; left main artery AJ 130 score: 217; left anterior descending AJ 130 score: 357; Left circumflex AJ 130 score: 58; right coronary artery AJ 130 score 828.CT Calcium Score   09/24/21 - Cardiac Cath at [facility] -  Left Main: The LMCA is large in size and shows no significant disease. LAD: The Proximal LAD is large in size and mildly diseased. The Mid LAD is large in size and mildly diseased. The Distal LAD is medium in size and mildly diseased. The 1st Diagonal is medium in size and mildly diseased.  Circumflex: The Proximal Circumflex is large in size and mildly diseased. The Distal Circumflex is small in size and mildly diseased. The 1st Marginal is small in size and mildly diseased. The 2nd Marginal is large in size. There is an 80% lesion in   the 2nd Marginal. RCA: The Proximal RCA is large in size and mildly diseased. The Mid RCA is large in size and mildly diseased. The Distal RCA is large in size and mildly diseased. The Right PDA is medium in size and mildly diseased. The 1st RPL is medium in size and mildly diseased. Successful PCI of the 80% OM2 stenosis with a 3.5X24 Synergy XD DES  Cardiac Cath   09/17/21 - Regadenoson MPI at [facility] - This study is abnormal. There is a small sized reversible perfusion defect involving the mid to basal inferior wall which is associated with a hypokinesis of the inferior wall. This is suggestive of ischemia. Left ventricular systolic function is mildly reduced. There is no transient ischemic dilation. Pulmonary to myocardial count ratio is normal. The pharmacologic ECG portion of the study is nondiagnostic for ischemia.   09/09/21 - Echocardiogram at [facility] - Technically difficult study. LVEF=70%. Normal LV size with hyperdynamic systolic function. No regional wall abnormalities identified on the echo contrast images. Right ventricular size and function are normal. Moderate left atrial enlargement. The aortic valve is trileaflet with nonspecific focal thickening without impairment in leaflet mobility or reduction in excursion. Increased gradients and velocities are likely from hyperdynamic LV function not aortic valve stenosis. No pericardial effusion. Estimated pulmonary artery systolic pressure is 39 mmHg.      Hypertension 08/23/2023    Hyperlipidemia 08/23/2023     On repatha, pt is intolerant to statins.      Former smoker 08/23/2023    Type 2 diabetes mellitus (CMS-HCC) 08/23/2023    S/P coronary artery stent placement 08/23/2023     09/24/21 - Cardiac Cath at [facility] -  Left Main: The LMCA is large in size and shows no significant disease. LAD: The Proximal LAD is large in size and mildly diseased. The Mid LAD is large in size and mildly diseased. The Distal LAD is medium in size and mildly diseased. The 1st Diagonal is medium in  size and mildly diseased.  Circumflex: The Proximal Circumflex is large in size and mildly diseased. The Distal Circumflex is small in size and mildly diseased. The 1st Marginal is small in size and mildly diseased. The 2nd Marginal is large in size. There is an 80% lesion in   the 2nd Marginal. RCA: The Proximal RCA is large in size and mildly diseased. The Mid RCA is large in size and mildly diseased. The Distal RCA is large in size and mildly diseased. The Right PDA is medium in size and mildly diseased. The 1st RPL is medium in size and mildly diseased. Successful PCI of the 80% OM2 stenosis with a 3.5X24 Synergy XD DES  Cardiac Cath                       Review of Systems   Constitutional: Negative.   HENT: Negative.     Eyes: Negative.    Cardiovascular: Negative.    Respiratory: Negative.     Endocrine: Negative.    Hematologic/Lymphatic: Negative.    Skin: Negative.    Musculoskeletal: Negative.    Gastrointestinal: Negative.    Genitourinary: Negative.    Neurological: Negative.    Psychiatric/Behavioral: Negative.     Allergic/Immunologic: Negative.        Physical Exam  GENERAL: The patient is well developed, well nourished, resting comfortably and in no distress.   HEENT: No abnormalities of the visible oro-nasopharynx, conjunctiva or sclera are noted.  NECK: There is no jugular venous distension. Carotids are palpable and without bruits. There is no thyroid enlargement.  Chest: Lung fields are clear to auscultation. There are no wheezes or crackles.  CV: There is a regular rhythm. The first and second heart sounds are normal.  A soft systolic ejection murmur is heard near the right upper sternal border.  There are no diastolic murmurs, gallops or rubs.  ABD: The abdomen is soft and supple with normal bowel sounds. There is no hepatosplenomegaly, ascites, tenderness, masses or bruits.  Neuro: There are no focal motor defects. Ambulation is normal. Cognitive function appears normal.  Ext: There is no edema or evidence of deep vein thrombosis. Peripheral pulses are satisfactory.    SKIN: There are no rashes and no cellulitis  PSYCH: The patient is calm, rationale and oriented.    Cardiovascular Studies  A twelve-lead ECG was obtained on 09/08/2023 reveals normal sinus rhythm with a heart rate of 60 bpm.  Right bundle branch block is noted.  A lot of his cardiovascular testing is listed above.  Cardiovascular Health Factors  Vitals BP Readings from Last 3 Encounters:   03/13/24 138/84   02/03/24 (!) 152/87   01/23/24 132/73     Wt Readings from Last 3 Encounters:   03/13/24 99.1 kg (218 lb 6.4 oz)   01/23/24 99.3 kg (219 lb)   01/23/24 94.3 kg (208 lb)     BMI Readings from Last 3 Encounters:   03/13/24 30.46 kg/m?   01/23/24 30.54 kg/m?   01/23/24 29.01 kg/m?      Smoking Tobacco Use History[1]   Lipid Profile Cholesterol   Date Value Ref Range Status   04/04/2023 93  Final     HDL   Date Value Ref Range Status   04/04/2023 27 (L) >=40 Final     LDL   Date Value Ref Range Status   04/04/2023 32  Final     Triglycerides   Date Value Ref Range Status  04/04/2023 174 (H) <150 Final      Blood Sugar Hemoglobin A1C   Date Value Ref Range Status   07/04/2023 5.6  Final     Glucose   Date Value Ref Range Status   01/09/2024 103 (H) 70 - 100 mg/dL Final   93/87/7975 792 (H) 74 - 106 Final   09/13/2022 272 (H) 74 - 106 Final     Glucose, POC   Date Value Ref Range Status   02/03/2024 138 (H) 70 - 100 mg/dL Final         Problems Addressed Today  Encounter Diagnoses   Name Primary?    Screening for heart disease     Hypertension, unspecified type     Hyperlipidemia, unspecified hyperlipidemia type     Coronary artery disease involving native heart, unspecified vessel or lesion type, unspecified whether angina present     Type 2 diabetes mellitus with other specified complication, unspecified whether long term insulin use (CMS-HCC)        Assessment and Plan   Mr. Fitzhenry reports that he is currently stable from a cardiovascular perspective and reports no angina or congestive symptoms.   He will continue to work with his primary care physician for control of his hypertension and diabetes mellitus.  I strongly recommend that he continue Repatha which has done an excellent job controlling his LDL cholesterol.  Cardiovascular risk factor management was reviewed in detail.  I have asked him to return for follow-up in 12 months time. The total time spent during this interview and exam with preparation and chart review was 30 minutes.          Current Medications (including today's revisions)   acetaminophen  (TYLENOL  EXTRA STRENGTH) 500 mg tablet Take two tablets by mouth at bedtime daily. Max of 4,000 mg of acetaminophen  in 24 hours.    amLODIPine (NORVASC) 10 mg tablet Take one tablet by mouth daily.    aspirin EC (ASPIR-LOW) 81 mg tablet Take one tablet by mouth daily. baclofen (LIORESAL) 20 mg tablet Take one tablet by mouth every 8 hours as needed.    celecoxib (CELEBREX) 100 mg capsule Take one capsule by mouth daily.    CHOLEcalciferoL (vitamin D3) (DIALYVITE VITAMIN D) 125 mcg (5,000 unit) capsule Take one capsule by mouth daily.    cyanocobalamin (vitamin B-12) 1,000 mcg tablet Take one tablet by mouth daily.    gabapentin (NEURONTIN) 300 mg capsule Take one capsule by mouth twice daily.    lisinopriL (ZESTRIL) 20 mg tablet Take one tablet by mouth daily.    LORazepam (ATIVAN) 0.5 mg tablet Take one tablet by mouth at bedtime as needed for Nausea.    metFORMIN (GLUCOPHAGE) 1,000 mg tablet Take one tablet by mouth daily with breakfast.    metoprolol tartrate 25 mg tablet Take two tablets by mouth twice daily.    nitroglycerin (NITROSTAT) 0.4 mg tablet Place one tablet under tongue every 5 minutes as needed.    ondansetron  HCL (ZOFRAN ) 4 mg tablet Take one tablet by mouth every 8 hours as needed for Nausea or Vomiting. Indications: prevent nausea and vomiting after surgery (Patient not taking: Reported on 03/13/2024)    oxyCODONE (ROXICODONE) 5 mg tablet Take one tablet by mouth every 6 hours as needed for Pain. Indications: pain (Patient not taking: Reported on 03/13/2024)    oxyCODONE-acetaminophen  (PERCOCET) 5-325 mg tablet Take one tablet by mouth daily as needed for Pain. (Patient not taking: Reported on 03/13/2024)    OZEMPIC 0.25 mg  or 0.5 mg (2 mg/3 mL) injection PEN Inject one-half mg under the skin every Saturday.    pantoprazole DR (PROTONIX) 40 mg tablet Take one tablet by mouth twice daily.    REPATHA SURECLICK 140 mg/mL injectable PEN Inject 1 mL under the skin every 14 days.                 [1]   Social History  Tobacco Use   Smoking Status Former    Current packs/day: 0.00    Average packs/day: 0.5 packs/day for 10.0 years (5.0 ttl pk-yrs)    Types: Cigarettes    Start date: 05/19/2006    Quit date: 05/19/2016    Years since quitting: 7.8    Passive exposure: Past   Smokeless Tobacco Never

## 2024-04-13 ENCOUNTER — Encounter: Admit: 2024-04-13 | Discharge: 2024-04-13 | Payer: BLUE CROSS/BLUE SHIELD
